# Patient Record
Sex: Male | Born: 1960 | Race: White | Hispanic: No | Marital: Married | Smoking: Never smoker
Health system: Southern US, Community
[De-identification: ages and names within clinical notes are randomized; demographics above are authoritative.]

## PROBLEM LIST (undated history)

## (undated) DIAGNOSIS — R569 Unspecified convulsions: Secondary | ICD-10-CM

## (undated) HISTORY — PX: HAND SURGERY: SHX662

---

## 2020-09-11 ENCOUNTER — Other Ambulatory Visit: Payer: Self-pay

## 2020-09-11 ENCOUNTER — Ambulatory Visit
Admission: EM | Admit: 2020-09-11 | Discharge: 2020-09-11 | Disposition: A | Discharge status: Home / Self Care | Payer: BC Managed Care – PPO | Attending: Family Medicine | Admitting: Family Medicine

## 2020-09-11 ENCOUNTER — Encounter: Payer: Self-pay | Admitting: Emergency Medicine

## 2020-09-11 DIAGNOSIS — R5383 Other fatigue: Secondary | ICD-10-CM

## 2020-09-11 DIAGNOSIS — R42 Dizziness and giddiness: Secondary | ICD-10-CM

## 2020-09-11 NOTE — ED Triage Notes (Signed)
Has been having "episodes of feeling like an out of body experience"  Wife states when this happens, he becomes sweaty and loses his color.  States hands and finger get numb from time to time.  Denies pain.  States today with an episode, he felt short of breath.

## 2020-09-11 NOTE — Discharge Instructions (Addendum)
We have drawn some labs today.  I have also placed a referral for neurology.  Call him and see if they will take this without a primary care referral.  They may see you with the urgent care referral.  We will be in contact with any abnormal lab results.  If things are getting worse, if you lose consciousness, if you feel more so like you are going to pass out, if you start having any chest pain, loss of strength in any of your limbs, limb heaviness, other concerning symptoms I would have you follow-up in the ER

## 2020-09-11 NOTE — ED Provider Notes (Signed)
[facility]    CSN: [id number] Arrival date & time: 09/11/20  1024      History   Chief Complaint No chief complaint on file.   HPI Ricardo Stout is a 60 y.o. male.   Reports that he has been having episodes of feeling "off."  Per wife, when these episodes happen she notices that he is taking long deep breaths, that he goes and sits or lies down somewhere, and that he gets very pale.  States that the episodes have happened a couple of times this week.  States that he started having episodes couple of months ago.  Does not have PCP.  Also states that either one of his hands and fingers at times will become numb and tingly.  States that he can usually get the feeling back with wiggling his hands and moving his arms.  With his episode that occurred this morning, he states that he got a little short of breath, and that is what brought him in today.  Does not take prescription medications.  Denies personal cardiac history.  Reports that his father had a heart attack at about 26 years old.  Denies headache, shortness of breath currently, increased fatigue, weakness, chest pain,, arm pain, radiating pain, jaw pain, back pain, diaphoresis, previous symptoms.  Inquiring about whether he could possibly be having small seizures.  Has not attempted to treat this at home other than with rest.  This seems to help him, and he feels like he is back to himself within a few minutes.  ROS per HPI  The history is provided by the patient.    History reviewed. No pertinent past medical history.  There are no problems to display for this patient.   History reviewed. No pertinent surgical history.     Home Medications    Prior to Admission medications   Not on File    Family History Family History  Problem Relation Age of Onset  . Thyroid disease Mother   . Cancer Father   . Heart attack Father   . COPD Father     Social History Social History   Tobacco Use  . Smoking  status: Never Smoker  . Smokeless tobacco: Never Used  Vaping Use  . Vaping Use: Never used  Substance Use Topics  . Alcohol use: Yes    Alcohol/week: 2.0 standard drinks    Types: 2 Cans of beer per week  . Drug use: Yes    Types: Marijuana    Comment: daily     Allergies   Patient has no known allergies.   Review of Systems Review of Systems   Physical Exam Triage Vital Signs ED Triage Vitals  Enc Vitals Group     BP 09/11/20 1052 125/81     Pulse Rate 09/11/20 1052 68     Resp 09/11/20 1052 16     Temp 09/11/20 1052 98.4 F (36.9 C)     Temp Source 09/11/20 1052 Oral     SpO2 09/11/20 1052 96 %     Weight --      Height --      Head Circumference --      Peak Flow --      Pain Score 09/11/20 1056 0     Pain Loc --      Pain Edu? --      Excl. in GC? --    No data found.  Updated Vital Signs BP 125/81 (BP Location: Right Arm)  Pulse 68   Temp 98.4 F (36.9 C) (Oral)   Resp 16   SpO2 96%   Physical Exam Vitals and nursing note reviewed.  Constitutional:      General: He is not in acute distress.    Appearance: Normal appearance. He is well-developed and normal weight. He is not ill-appearing.  HENT:     Head: Normocephalic and atraumatic.     Right Ear: Tympanic membrane, ear canal and external ear normal.     Left Ear: Tympanic membrane, ear canal and external ear normal.     Mouth/Throat:     Mouth: Mucous membranes are moist.     Pharynx: Oropharynx is clear.  Eyes:     Extraocular Movements: Extraocular movements intact.     Conjunctiva/sclera: Conjunctivae normal.     Pupils: Pupils are equal, round, and reactive to light.  Cardiovascular:     Rate and Rhythm: Normal rate and regular rhythm.     Heart sounds: Normal heart sounds. No murmur heard.   Pulmonary:     Effort: Pulmonary effort is normal. No respiratory distress.     Breath sounds: Normal breath sounds. No stridor. No wheezing, rhonchi or rales.  Chest:     Chest wall: No  tenderness.  Abdominal:     General: Bowel sounds are normal. There is no distension.     Palpations: Abdomen is soft. There is no mass.     Tenderness: There is no abdominal tenderness. There is no right CVA tenderness, left CVA tenderness, guarding or rebound.     Hernia: No hernia is present.  Musculoskeletal:        General: Normal range of motion.     Cervical back: Normal range of motion and neck supple.  Skin:    General: Skin is warm and dry.     Capillary Refill: Capillary refill takes less than 2 seconds.  Neurological:     General: No focal deficit present.     Mental Status: He is alert and oriented to person, place, and time.     Cranial Nerves: No cranial nerve deficit.     Sensory: No sensory deficit.     Motor: No weakness.     Coordination: Coordination normal.     Gait: Gait normal.     Deep Tendon Reflexes: Reflexes normal.  Psychiatric:        Mood and Affect: Mood normal.        Behavior: Behavior normal.        Thought Content: Thought content normal.      UC Treatments / Results  Labs (all labs ordered are listed, but only abnormal results are displayed) Labs Reviewed  CBC WITH DIFFERENTIAL/PLATELET  COMPREHENSIVE METABOLIC PANEL  TSH    EKG   Radiology No results found.  Procedures Procedures (including critical care time)  Medications Ordered in UC Medications - No data to display  Initial Impression / Assessment and Plan / UC Course  I have reviewed the triage vital signs and the nursing notes.  Pertinent labs & imaging results that were available during my care of the patient were reviewed by me and considered in my medical decision making (see chart for details).    Fatigue Episodic lightheadedness  CBC, CMP, TSH drawn today Will be in contact with abnormal results Get established with PCP Placed neurology referral Discussed that this could be related to his heart, or his brain Discussed to keep a log of his  symptoms Discussed establishing PCP  Discussed that if episodes occur more often, with more intensity, or if you lose consciousness, to follow-up in the ER for more acute intervention  Final Clinical Impressions(s) / UC Diagnoses   Final diagnoses:  Other fatigue  Episodic lightheadedness     Discharge Instructions     We have drawn some labs today.  I have also placed a referral for neurology.  Call him and see if they will take this without a primary care referral.  They may see you with the urgent care referral.  We will be in contact with any abnormal lab results.  If things are getting worse, if you lose consciousness, if you feel more so like you are going to pass out, if you start having any chest pain, loss of strength in any of your limbs, limb heaviness, other concerning symptoms I would have you follow-up in the ER    ED Prescriptions    None     PDMP not reviewed this encounter.   Moshe Cipro, NP 09/11/20 1924

## 2020-09-12 LAB — COMPREHENSIVE METABOLIC PANEL
ALT: 22 IU/L (ref 0–44)
AST: 24 IU/L (ref 0–40)
Albumin/Globulin Ratio: 2.3 — ABNORMAL HIGH (ref 1.2–2.2)
Albumin: 4.8 g/dL (ref 3.8–4.9)
Alkaline Phosphatase: 105 IU/L (ref 44–121)
BUN/Creatinine Ratio: 15 (ref 9–20)
BUN: 13 mg/dL (ref 6–24)
Bilirubin Total: 0.4 mg/dL (ref 0.0–1.2)
CO2: 17 mmol/L — ABNORMAL LOW (ref 20–29)
Calcium: 9.8 mg/dL (ref 8.7–10.2)
Chloride: 104 mmol/L (ref 96–106)
Creatinine, Ser: 0.87 mg/dL (ref 0.76–1.27)
Globulin, Total: 2.1 g/dL (ref 1.5–4.5)
Glucose: 93 mg/dL (ref 65–99)
Potassium: 4.8 mmol/L (ref 3.5–5.2)
Sodium: 136 mmol/L (ref 134–144)
Total Protein: 6.9 g/dL (ref 6.0–8.5)
eGFR: 100 mL/min/{1.73_m2} (ref >59)

## 2020-09-12 LAB — CBC WITH DIFFERENTIAL/PLATELET
Basophils Absolute: 0 10*3/uL (ref 0.0–0.2)
Basos: 1 %
EOS (ABSOLUTE): 0 10*3/uL (ref 0.0–0.4)
Eos: 0 %
Hematocrit: 45.6 % (ref 37.5–51.0)
Hemoglobin: 15.9 g/dL (ref 13.0–17.7)
Immature Grans (Abs): 0 10*3/uL (ref 0.0–0.1)
Immature Granulocytes: 0 %
Lymphocytes Absolute: 1.2 10*3/uL (ref 0.7–3.1)
Lymphs: 15 %
MCH: 32.4 pg (ref 26.6–33.0)
MCHC: 34.9 g/dL (ref 31.5–35.7)
MCV: 93 fL (ref 79–97)
Monocytes Absolute: 0.5 10*3/uL (ref 0.1–0.9)
Monocytes: 6 %
Neutrophils Absolute: 6.4 10*3/uL (ref 1.4–7.0)
Neutrophils: 78 %
Platelets: 272 10*3/uL (ref 150–450)
RBC: 4.91 x10E6/uL (ref 4.14–5.80)
RDW: 11.9 % (ref 11.6–15.4)
WBC: 8.1 10*3/uL (ref 3.4–10.8)

## 2020-09-12 LAB — TSH: TSH: 1.27 u[IU]/mL (ref 0.450–4.500)

## 2020-10-07 ENCOUNTER — Ambulatory Visit (INDEPENDENT_AMBULATORY_CARE_PROVIDER_SITE_OTHER): Payer: BC Managed Care – PPO | Admitting: Diagnostic Neuroimaging

## 2020-10-07 ENCOUNTER — Telehealth: Payer: Self-pay | Admitting: Diagnostic Neuroimaging

## 2020-10-07 ENCOUNTER — Encounter: Payer: Self-pay | Admitting: Diagnostic Neuroimaging

## 2020-10-07 VITALS — BP 109/67 | HR 55 | Ht 69.0 in | Wt 179.8 lb

## 2020-10-07 DIAGNOSIS — R4689 Other symptoms and signs involving appearance and behavior: Secondary | ICD-10-CM | POA: Diagnosis not present

## 2020-10-07 NOTE — Patient Instructions (Signed)
ABNL SPELLS (disconnected feeling, pale appearance, sweating; seizure vs pre-syncope) - MRI brain, EEG - caution with driving - follow up with PCP and cardiology

## 2020-10-07 NOTE — Telephone Encounter (Addendum)
MRI brain w/wo contrast 45 mins dr. Marjory Lies bcbs Berkley Harvey: [phone] (10/07/20-12/05/20)   Scheduled at [facility] 10/09/20

## 2020-10-07 NOTE — Progress Notes (Signed)
[facility]  PATIENT: Ricardo Stout DOB: 05-19-1960  REFERRING CLINICIAN: Moshe Cipro, NP HISTORY FROM: patient  REASON FOR VISIT: new consult    HISTORICAL  CHIEF COMPLAINT:  Chief Complaint  Patient presents with  . Lightheadedness, fatigue    Rm 7 New pt, ED referral, wife- [name] "in past 3-4 months- have a feeling of disconnecting, sweating, breathing changes, color drains, happening frequently, last 30-60 seconds"     HISTORY OF PRESENT ILLNESS:   60 year old male here for evaluation of abnormal spells.  For past 3 to 4 months patient has had onset of abnormal sensations consisting of disconnected feeling, sweating, pale appearance, heavy breathing, lasting 30 to 60 seconds at a time.  Patient has had multiple episodes per day.  No specific triggering factors.  These can occur when sitting or resting.  A few of these episodes of occurred when he is concentrating on the computer or scrolling through his phone quickly.  He has not had any episodes last week and a half.  No prior similar events.  No family history of seizures.  No prior head injuries or traumas.   REVIEW OF SYSTEMS: Full 14 system review of systems performed and negative with exception of: as per HPI.    ALLERGIES: No Known Allergies  HOME MEDICATIONS: No outpatient medications prior to visit.   No facility-administered medications prior to visit.    PAST MEDICAL HISTORY: No past medical history on file.  PAST SURGICAL HISTORY: Past Surgical History:  Procedure Laterality Date  . HAND SURGERY Right     FAMILY HISTORY: Family History  Problem Relation Age of Onset  . Thyroid disease Mother   . Cancer Father   . Heart attack Father   . COPD Father   . Breast cancer Sister   . Hypertension Sister     SOCIAL HISTORY: Social History   Socioeconomic History  . Marital status: Married    Spouse name: [name]  . Number of children: 1  . Years of education: Not on file   . Highest education level: Not on file  Occupational History  . Not on file  Tobacco Use  . Smoking status: Never Smoker  . Smokeless tobacco: Never Used  Vaping Use  . Vaping Use: Never used  Substance and Sexual Activity  . Alcohol use: Yes    Alcohol/week: 2.0 standard drinks    Types: 2 Cans of beer per week    Comment: or less  . Drug use: Yes    Types: Marijuana    Comment: 10/07/20 daily  . Sexual activity: Not on file  Other Topics Concern  . Not on file  Social History Narrative   Lives with wife   'caffeine- maybe 1 soda a day   Social Determinants of Health   Financial Resource Strain: Not on file  Food Insecurity: Not on file  Transportation Needs: Not on file  Physical Activity: Not on file  Stress: Not on file  Social Connections: Not on file  Intimate Partner Violence: Not on file     PHYSICAL EXAM  GENERAL EXAM/CONSTITUTIONAL: Vitals:  Vitals:   10/07/20 0928  BP: 109/67  Pulse: (!) 55  Weight: 179 lb 12.8 oz (81.6 kg)  Height: 5\' 9"  (1.753 m)   Body mass index is 26.55 kg/m. Wt Readings from Last 3 Encounters:  10/07/20 179 lb 12.8 oz (81.6 kg)    Patient is in no distress; well developed, nourished and groomed; neck is supple  CARDIOVASCULAR:  Examination of carotid arteries is normal; no carotid bruits  Regular rate and rhythm, no murmurs  Examination of peripheral vascular system by observation and palpation is normal  EYES:  Ophthalmoscopic exam of optic discs and posterior segments is normal; no papilledema or hemorrhages No exam data present  MUSCULOSKELETAL:  Gait, strength, tone, movements noted in Neurologic exam below  NEUROLOGIC: MENTAL STATUS:  No flowsheet data found.  awake, alert, oriented to person, place and time  recent and remote memory intact  normal attention and concentration  language fluent, comprehension intact, naming intact  fund of knowledge appropriate  CRANIAL NERVE:   2nd - no  papilledema on fundoscopic exam  2nd, 3rd, 4th, 6th - pupils equal and reactive to light, visual fields full to confrontation, extraocular muscles intact, no nystagmus  5th - facial sensation symmetric  7th - facial strength symmetric  8th - hearing intact  9th - palate elevates symmetrically, uvula midline  11th - shoulder shrug symmetric  12th - tongue protrusion midline  MOTOR:   normal bulk and tone, full strength in the BUE, BLE  SENSORY:   normal and symmetric to light touch, temperature, vibration  COORDINATION:   finger-nose-finger, fine finger movements normal  REFLEXES:   deep tendon reflexes present and symmetric  GAIT/STATION:   narrow based gait     DIAGNOSTIC DATA (LABS, IMAGING, TESTING) - I reviewed patient records, labs, notes, testing and imaging myself where available.  Lab Results  Component Value Date   WBC 8.1 09/11/2020   HGB 15.9 09/11/2020   HCT 45.6 09/11/2020   MCV 93 09/11/2020   PLT 272 09/11/2020      Component Value Date/Time   NA 136 09/11/2020 1140   K 4.8 09/11/2020 1140   CL 104 09/11/2020 1140   CO2 17 (L) 09/11/2020 1140   GLUCOSE 93 09/11/2020 1140   BUN 13 09/11/2020 1140   CREATININE 0.87 09/11/2020 1140   CALCIUM 9.8 09/11/2020 1140   PROT 6.9 09/11/2020 1140   ALBUMIN 4.8 09/11/2020 1140   AST 24 09/11/2020 1140   ALT 22 09/11/2020 1140   ALKPHOS 105 09/11/2020 1140   BILITOT 0.4 09/11/2020 1140   No results found for: CHOL, HDL, LDLCALC, LDLDIRECT, TRIG, CHOLHDL No results found for: LKGM0N No results found for: VITAMINB12 Lab Results  Component Value Date   TSH 1.270 09/11/2020       ASSESSMENT AND PLAN  60 y.o. year old male here with multiple stereotyped episodes of disconnected feeling, out of body sensation, sweating, pale appearance, heavy breathing, suspicious for complex partial seizures.  Will check MRI and EEG for further evaluation.  Recommend PCP and cardiology evaluation to rule out  other medical/cardiac etiologies for hypotension/presyncope.  Patient does not fully lose consciousness and is able to control himself during episodes with good warning, therefore recommend caution with driving.  Offered to start antiseizure medications but patient is reluctant at this time would like to pursue testing first.   Dx:  1. Spell of abnormal behavior      PLAN:  ABNL SPELLS (disconnected feeling, pale appearance, sweating; seizure vs pre-syncope) - MRI brain, EEG - caution with driving - consider levetiracetam if symptoms continue - follow up with PCP and cardiology  Orders Placed This Encounter  Procedures  . MR BRAIN W WO CONTRAST  . EEG adult   Return in about 3 months (around 01/07/2021).  I reviewed images, labs, notes, records myself. I summarized findings and reviewed with patient, for this  high risk condition (abnl spells; seizure?) requiring high complexity decision making.    Suanne Marker, MD 10/07/2020, 10:16 AM Certified in Neurology, Neurophysiology and Neuroimaging  [facility] [address], [address] [place], [place] [address] [phone]

## 2020-10-08 ENCOUNTER — Other Ambulatory Visit: Payer: BC Managed Care – PPO

## 2020-10-09 ENCOUNTER — Other Ambulatory Visit: Payer: BC Managed Care – PPO

## 2020-10-09 ENCOUNTER — Encounter: Payer: Self-pay | Admitting: Diagnostic Neuroimaging

## 2020-11-10 ENCOUNTER — Emergency Department (HOSPITAL_COMMUNITY)
Admission: EM | Admit: 2020-11-10 | Discharge: 2020-11-10 | Disposition: A | Discharge status: Home / Self Care | Payer: BC Managed Care – PPO | Source: Home / Self Care | Attending: Emergency Medicine | Admitting: Emergency Medicine

## 2020-11-10 ENCOUNTER — Emergency Department (HOSPITAL_COMMUNITY): Payer: BC Managed Care – PPO

## 2020-11-10 DIAGNOSIS — R569 Unspecified convulsions: Secondary | ICD-10-CM

## 2020-11-10 DIAGNOSIS — M25512 Pain in left shoulder: Secondary | ICD-10-CM | POA: Insufficient documentation

## 2020-11-10 DIAGNOSIS — S32028A Other fracture of second lumbar vertebra, initial encounter for closed fracture: Secondary | ICD-10-CM | POA: Insufficient documentation

## 2020-11-10 DIAGNOSIS — S22080A Wedge compression fracture of T11-T12 vertebra, initial encounter for closed fracture: Secondary | ICD-10-CM

## 2020-11-10 DIAGNOSIS — S22088A Other fracture of T11-T12 vertebra, initial encounter for closed fracture: Secondary | ICD-10-CM | POA: Insufficient documentation

## 2020-11-10 DIAGNOSIS — M545 Low back pain, unspecified: Secondary | ICD-10-CM | POA: Insufficient documentation

## 2020-11-10 DIAGNOSIS — X58XXXA Exposure to other specified factors, initial encounter: Secondary | ICD-10-CM | POA: Insufficient documentation

## 2020-11-10 DIAGNOSIS — S32020A Wedge compression fracture of second lumbar vertebra, initial encounter for closed fracture: Secondary | ICD-10-CM

## 2020-11-10 DIAGNOSIS — M25511 Pain in right shoulder: Secondary | ICD-10-CM | POA: Insufficient documentation

## 2020-11-10 DIAGNOSIS — Y900 Blood alcohol level of less than 20 mg/100 ml: Secondary | ICD-10-CM | POA: Insufficient documentation

## 2020-11-10 DIAGNOSIS — S22089A Unspecified fracture of T11-T12 vertebra, initial encounter for closed fracture: Secondary | ICD-10-CM | POA: Diagnosis not present

## 2020-11-10 LAB — CBC WITH DIFFERENTIAL/PLATELET
Abs Immature Granulocytes: 0.31 10*3/uL — ABNORMAL HIGH (ref 0.00–0.07)
Abs Immature Granulocytes: 0.22 10*3/uL — ABNORMAL HIGH (ref 0.00–0.07)
Basophils Absolute: 0.1 10*3/uL (ref 0.0–0.1)
Basophils Absolute: 0 10*3/uL (ref 0.0–0.1)
Basophils Relative: 0 %
Basophils Relative: 0 %
Eosinophils Absolute: 0 10*3/uL (ref 0.0–0.5)
Eosinophils Absolute: 0 10*3/uL (ref 0.0–0.5)
Eosinophils Relative: 0 %
Eosinophils Relative: 0 %
HCT: 44.8 % (ref 39.0–52.0)
HCT: 44.6 % (ref 39.0–52.0)
Hemoglobin: 15.4 g/dL (ref 13.0–17.0)
Hemoglobin: 15.3 g/dL (ref 13.0–17.0)
Immature Granulocytes: 2 %
Immature Granulocytes: 1 %
Lymphocytes Relative: 4 %
Lymphocytes Relative: 3 %
Lymphs Abs: 0.9 10*3/uL (ref 0.7–4.0)
Lymphs Abs: 0.5 10*3/uL — ABNORMAL LOW (ref 0.7–4.0)
MCH: 32.7 pg (ref 26.0–34.0)
MCH: 32.9 pg (ref 26.0–34.0)
MCHC: 34.4 g/dL (ref 30.0–36.0)
MCHC: 34.3 g/dL (ref 30.0–36.0)
MCV: 95.1 fL (ref 80.0–100.0)
MCV: 95.9 fL (ref 80.0–100.0)
Monocytes Absolute: 1.2 10*3/uL — ABNORMAL HIGH (ref 0.1–1.0)
Monocytes Absolute: 1.1 10*3/uL — ABNORMAL HIGH (ref 0.1–1.0)
Monocytes Relative: 6 %
Monocytes Relative: 6 %
Neutro Abs: 18.7 10*3/uL — ABNORMAL HIGH (ref 1.7–7.7)
Neutro Abs: 17.5 10*3/uL — ABNORMAL HIGH (ref 1.7–7.7)
Neutrophils Relative %: 88 %
Neutrophils Relative %: 90 %
Platelets: 218 10*3/uL (ref 150–400)
Platelets: 204 10*3/uL (ref 150–400)
RBC: 4.71 MIL/uL (ref 4.22–5.81)
RBC: 4.65 MIL/uL (ref 4.22–5.81)
RDW: 12.1 % (ref 11.5–15.5)
RDW: 12.5 % (ref 11.5–15.5)
WBC: 21.1 10*3/uL — ABNORMAL HIGH (ref 4.0–10.5)
WBC: 19.4 10*3/uL — ABNORMAL HIGH (ref 4.0–10.5)
nRBC: 0 % (ref 0.0–0.2)
nRBC: 0 % (ref 0.0–0.2)

## 2020-11-10 LAB — COMPREHENSIVE METABOLIC PANEL
ALT: 32 U/L (ref 0–44)
ALT: 34 U/L (ref 0–44)
AST: 37 U/L (ref 15–41)
AST: 48 U/L — ABNORMAL HIGH (ref 15–41)
Albumin: 3.8 g/dL (ref 3.5–5.0)
Albumin: 3.7 g/dL (ref 3.5–5.0)
Alkaline Phosphatase: 75 U/L (ref 38–126)
Alkaline Phosphatase: 70 U/L (ref 38–126)
Anion gap: 9 (ref 5–15)
Anion gap: 12 (ref 5–15)
BUN: 14 mg/dL (ref 6–20)
BUN: 14 mg/dL (ref 6–20)
CO2: 21 mmol/L — ABNORMAL LOW (ref 22–32)
CO2: 17 mmol/L — ABNORMAL LOW (ref 22–32)
Calcium: 8.9 mg/dL (ref 8.9–10.3)
Calcium: 8.8 mg/dL — ABNORMAL LOW (ref 8.9–10.3)
Chloride: 106 mmol/L (ref 98–111)
Chloride: 107 mmol/L (ref 98–111)
Creatinine, Ser: 1.1 mg/dL (ref 0.61–1.24)
Creatinine, Ser: 1.15 mg/dL (ref 0.61–1.24)
GFR, Estimated: >60 mL/min (ref >60)
GFR, Estimated: >60 mL/min (ref >60)
Glucose, Bld: 128 mg/dL — ABNORMAL HIGH (ref 70–99)
Glucose, Bld: 129 mg/dL — ABNORMAL HIGH (ref 70–99)
Potassium: 3.7 mmol/L (ref 3.5–5.1)
Potassium: 4.4 mmol/L (ref 3.5–5.1)
Sodium: 136 mmol/L (ref 135–145)
Sodium: 136 mmol/L (ref 135–145)
Total Bilirubin: 1 mg/dL (ref 0.3–1.2)
Total Bilirubin: 1.4 mg/dL — ABNORMAL HIGH (ref 0.3–1.2)
Total Protein: 6.2 g/dL — ABNORMAL LOW (ref 6.5–8.1)
Total Protein: 6.2 g/dL — ABNORMAL LOW (ref 6.5–8.1)

## 2020-11-10 LAB — MAGNESIUM: Magnesium: 2 mg/dL (ref 1.7–2.4)

## 2020-11-10 LAB — CBG MONITORING, ED: Glucose-Capillary: 160 mg/dL — ABNORMAL HIGH (ref 70–99)

## 2020-11-10 LAB — ETHANOL: Alcohol, Ethyl (B): <10 mg/dL (ref <10)

## 2020-11-10 MED ORDER — HYDROMORPHONE HCL 1 MG/ML IJ SOLN
1.0000 mg | Freq: Once | INTRAMUSCULAR | Status: AC
Start: 2020-11-10 — End: 2020-11-10
  Administered 2020-11-10: 1 mg via INTRAVENOUS
  Filled 2020-11-10: qty 1

## 2020-11-10 MED ORDER — GADOBUTROL 1 MMOL/ML IV SOLN
7.5000 mL | Freq: Once | INTRAVENOUS | Status: AC | PRN
  Administered 2020-11-10: 7.5 mL via INTRAVENOUS

## 2020-11-10 MED ORDER — MORPHINE SULFATE (PF) 4 MG/ML IV SOLN
4.0000 mg | Freq: Once | INTRAVENOUS | Status: AC
  Administered 2020-11-10: 4 mg via INTRAVENOUS
  Filled 2020-11-10: qty 1

## 2020-11-10 MED ORDER — ONDANSETRON HCL 4 MG/2ML IJ SOLN
4.0000 mg | Freq: Once | INTRAMUSCULAR | Status: AC
  Administered 2020-11-10: 4 mg via INTRAVENOUS
  Filled 2020-11-10: qty 2

## 2020-11-10 MED ORDER — SODIUM CHLORIDE 0.9 % IV BOLUS
500.0000 mL | Freq: Once | INTRAVENOUS | Status: AC
  Administered 2020-11-10: 500 mL via INTRAVENOUS

## 2020-11-10 MED ORDER — ONDANSETRON 4 MG PO TBDP: 4.0000 mg | ORAL_TABLET | Freq: Three times a day (TID) | ORAL | 0 refills | Status: DC | PRN

## 2020-11-10 MED ORDER — LACTATED RINGERS IV BOLUS
1000.0000 mL | Freq: Once | INTRAVENOUS | Status: AC
  Administered 2020-11-10: 1000 mL via INTRAVENOUS

## 2020-11-10 MED ORDER — CALCIUM CARBONATE ANTACID 500 MG PO CHEW
1.0000 | CHEWABLE_TABLET | Freq: Once | ORAL | Status: AC
  Administered 2020-11-10: 200 mg via ORAL
  Filled 2020-11-10: qty 1

## 2020-11-10 MED ORDER — LEVETIRACETAM 500 MG PO TABS: 500.0000 mg | ORAL_TABLET | Freq: Two times a day (BID) | ORAL | 0 refills | Status: DC

## 2020-11-10 MED ORDER — OXYCODONE-ACETAMINOPHEN 5-325 MG PO TABS
2.0000 | ORAL_TABLET | Freq: Once | ORAL | Status: AC
  Administered 2020-11-10: 2 via ORAL
  Filled 2020-11-10: qty 2

## 2020-11-10 MED ORDER — HYDROMORPHONE HCL 1 MG/ML IJ SOLN
1.0000 mg | Freq: Once | INTRAMUSCULAR | Status: AC
  Administered 2020-11-10: 1 mg via INTRAVENOUS
  Filled 2020-11-10: qty 1

## 2020-11-10 MED ORDER — KETOROLAC TROMETHAMINE 30 MG/ML IJ SOLN
30.0000 mg | Freq: Once | INTRAMUSCULAR | Status: AC
  Administered 2020-11-10: 30 mg via INTRAVENOUS
  Filled 2020-11-10: qty 1

## 2020-11-10 MED ORDER — OXYCODONE-ACETAMINOPHEN 5-325 MG PO TABS: 1.0000 | ORAL_TABLET | Freq: Four times a day (QID) | ORAL | 0 refills | Status: DC | PRN

## 2020-11-10 MED ORDER — LEVETIRACETAM IN NACL 1000 MG/100ML IV SOLN
1000.0000 mg | Freq: Two times a day (BID) | INTRAVENOUS | Status: DC
  Administered 2020-11-10: 1000 mg via INTRAVENOUS
  Filled 2020-11-10: qty 100

## 2020-11-10 MED ORDER — OXYCODONE-ACETAMINOPHEN 5-325 MG PO TABS: 1.0000 | ORAL_TABLET | Freq: Four times a day (QID) | ORAL | 0 refills | Status: AC | PRN

## 2020-11-10 NOTE — ED Notes (Signed)
[facility] called at 20:34; ambulance will be here soon as patient is second in line.

## 2020-11-10 NOTE — ED Notes (Signed)
Pt and family verbalized understanding of discharge paperwork, including follow-up care and prescription.

## 2020-11-10 NOTE — Progress Notes (Signed)
Orthopedic Tech Progress Note Patient Details:  Ricardo Stout 14-Aug-1960 [id number]  Patient ID: [name], male   DOB: [birth year], 60 y.o.   MRN: [id number] [employer] [facility] Post 11/10/2020, 7:18 AM

## 2020-11-10 NOTE — ED Provider Notes (Signed)
[facility] Provider Note   CSN: [id number] Arrival date & time: 11/10/20  1515     History Chief Complaint  Patient presents with   Seizures    Ricardo MelterRussell Stout is a 60 y.o. male.  HPI Patient presents from home after witnessed seizure.  Seizure was several moments long, and EMS notes that on arrival the patient was postictal, but improved in route, was ANO x3 prior to ED arrival. The patient self is quiet, complains of bilateral shoulder pain, denies other new pain aside from that in his lower back.  He has recent injury during which he sustained multiple compression fractures and is currently wearing his immobilization brace. Additional history obtained by the patient's wife on her arrival, and chart review.  On chart review patient has a ongoing evaluation with neurology, including visit 1 month ago during which plan was for EEG, MRI, initiation of Keppra if you continue to have brief periods of altered mental status which were occurring for several weeks at that point. Patient notes that he has not had follow-up yet with his neurologist, no EEG.  Did, however, have MRI performed last night.  Patient was seen and evaluated yesterday after what sounds like a similar seizure event, though of longer duration than this morning.  During last night's event he also had a fall, sustained a compression fracture as above.     No past medical history on file.  There are no problems to display for this patient.   Past Surgical History:  Procedure Laterality Date   HAND SURGERY Right        Family History  Problem Relation Age of Onset   Thyroid disease Mother    Cancer Father    Heart attack Father    COPD Father    Breast cancer Sister    Hypertension Sister     Social History   Tobacco Use   Smoking status: Never   Smokeless tobacco: Never  Vaping Use   Vaping Use: Never used  Substance Use Topics   Alcohol use: Yes     Alcohol/week: 2.0 standard drinks    Types: 2 Cans of beer per week    Comment: or less   Drug use: Yes    Types: Marijuana    Comment: 10/07/20 daily    Home Medications Prior to Admission medications   Medication Sig Start Date End Date Taking? Authorizing Provider  oxyCODONE-acetaminophen (PERCOCET) 5-325 MG tablet Take 1 tablet by mouth every 6 (six) hours as needed for up to 5 days for severe pain. 11/10/20 11/15/20  Horton, Clabe SealKristie M, DO    Allergies    Patient has no known allergies.  Review of Systems   Review of Systems  Constitutional:        Per HPI, otherwise negative  HENT:         Per HPI, otherwise negative  Respiratory:         Per HPI, otherwise negative  Cardiovascular:        Per HPI, otherwise negative  Gastrointestinal:  Negative for vomiting.  Endocrine:       Negative aside from HPI  Genitourinary:        Neg aside from HPI   Musculoskeletal:        Per HPI, otherwise negative  Skin: Negative.   Neurological:  Positive for seizures. Negative for syncope.   Physical Exam Updated Vital Signs BP 125/88   Pulse 86   Temp 98.5 F (  36.9 C) (Oral)   Resp 19   Ht  (1.778 m)   Wt 79.4 kg   SpO2 93%   BMI 25.11 kg/m   Physical Exam Vitals and nursing note reviewed.  Constitutional:      General: He is not in acute distress.    Appearance: He is well-developed.  HENT:     Head: Normocephalic and atraumatic.  Eyes:     Conjunctiva/sclera: Conjunctivae normal.  Cardiovascular:     Rate and Rhythm: Normal rate and regular rhythm.  Pulmonary:     Effort: Pulmonary effort is normal. No respiratory distress.     Breath sounds: No stridor.  Chest:     Comments: Exoskeleton lumbar vertebral fracture brace in place. Abdominal:     General: There is no distension.  Musculoskeletal:     Comments: Patient describes pain in both shoulders, though range of motion is slightly restricted secondary to pain, he can flex and extend both shoulder, abduct  as well.  No gross deformity.  Skin:    General: Skin is warm and dry.  Neurological:     Mental Status: He is alert and oriented to person, place, and time.     Motor: No tremor, atrophy or abnormal muscle tone.    ED Results / Procedures / Treatments   Labs (all labs ordered are listed, but only abnormal results are displayed) Labs Reviewed  CBC WITH DIFFERENTIAL/PLATELET - Abnormal; Notable for the following components:      Result Value   WBC 19.4 (*)    Neutro Abs 17.5 (*)    Lymphs Abs 0.5 (*)    Monocytes Absolute 1.1 (*)    Abs Immature Granulocytes 0.22 (*)    All other components within normal limits  COMPREHENSIVE METABOLIC PANEL - Abnormal; Notable for the following components:   CO2 17 (*)    Glucose, Bld 129 (*)    Calcium 8.8 (*)    Total Protein 6.2 (*)    AST 48 (*)    Total Bilirubin 1.4 (*)    All other components within normal limits  CBG MONITORING, ED - Abnormal; Notable for the following components:   Glucose-Capillary 160 (*)    All other components within normal limits  ETHANOL  URINALYSIS, ROUTINE W REFLEX MICROSCOPIC  RAPID URINE DRUG SCREEN, HOSP PERFORMED    EKG None  Radiology CT Head Wo Contrast  Result Date: 11/10/2020 CLINICAL DATA:  60 year old male with dizziness. EXAM: CT HEAD WITHOUT CONTRAST TECHNIQUE: Contiguous axial images were obtained from the base of the skull through the vertex without intravenous contrast. COMPARISON:  None. FINDINGS: Brain: The ventricles and sulci appropriate size for patient's age. The gray-white matter discrimination is preserved. There is no acute intracranial hemorrhage. No mass effect or midline shift. No extra-axial fluid collection. Vascular: No hyperdense vessel or unexpected calcification. Skull: Normal. Negative for fracture or focal lesion. Sinuses/Orbits: There is mucoperiosteal thickening of paranasal sinuses with opacification of several ethmoid air cells. The mastoid air cells are clear.  Air-fluid level. Other: None IMPRESSION: 1. Unremarkable noncontrast CT of the brain. 2. Paranasal sinus disease. Electronically Signed   By: Elgie Collard M.D.   On: 11/10/2020 03:21   CT Lumbar Spine Wo Contrast  Result Date: 11/10/2020 CLINICAL DATA:  Initial evaluation for low back pain, trauma. EXAM: CT LUMBAR SPINE WITHOUT CONTRAST TECHNIQUE: Multidetector CT imaging of the lumbar spine was performed without intravenous contrast administration. Multiplanar CT image reconstructions were also generated. COMPARISON:  None. FINDINGS:  Segmentation: Standard. Lowest well-formed disc space labeled the L5-S1 level. Alignment: Physiologic with preservation of the normal lumbar lordosis. No listhesis. Vertebrae: Mild compression deformity involving the superior endplate of T11 with no more than mild 10% central height loss but no bony retropulsion, age indeterminate. Acute burst type compression fracture involving the T12 vertebral body with up to 35% height loss and 4 mm bony retropulsion. Suspected additional subtle acute compression fracture involving the superior endplate of L1 with minimal height loss but no bony retropulsion. Acute burst type compression fracture involving the superior endplate of L2 with up to 25% height loss with trace 2 mm bony retropulsion. Compression deformity involving the inferior endplate of L4 with up to 35% height loss without bony retropulsion, chronic in appearance. No other acute fracture. Visualized sacrum and pelvis intact. No discrete osseous lesions. Paraspinal and other soft tissues: Mild paraspinous edema adjacent to the acute compression fractures. Paraspinous soft tissues demonstrate no other acute finding. Mild aorto bi-iliac atherosclerotic disease. Disc levels: T11-12: 4 mm bony retropulsion related to the acute T12 compression fracture. No significant spinal stenosis. Foramina remain patent. T12-L1: Unremarkable. L1-2: Trace 2 mm bony retropulsion related to the L2  compression fracture. No significant spinal stenosis. Foramina remain patent. L2-3:  Negative interspace.  Mild facet hypertrophy.  No stenosis. L3-4: Mild disc bulge. Superimposed left foraminal disc protrusion contacts the exiting left L3 nerve root (series 3, image 102). Mild facet hypertrophy. No spinal stenosis. Mild left foraminal narrowing. L4-5: Mild disc bulge. Superimposed broad base right subarticular to foraminal disc protrusion, potentially affecting either the exiting right L4 or descending L5 nerve roots (series 3, image 118). Mild facet hypertrophy. No significant spinal stenosis. Mild left with mild to moderate right L4 foraminal narrowing. L5-S1: Mild disc bulge, eccentric to the left. 8 mm soft tissue density at the level of the left neural foramen likely reflects a small extruded disc fragment (series 3, image 133). This closely approximates and could potentially affect the exiting left L5 nerve root. Mild facet hypertrophy. No significant spinal stenosis. Mild left foraminal narrowing. IMPRESSION: 1. Acute compression fractures involving the T12 and L2 vertebral bodies, with additional possible subtle compression fracture involving the superior endplate of L1 as above. 2. Mild compression deformity involving the superior endplate of T11, age indeterminate, and could be chronic in nature. 3. Mild compression deformity involving the inferior endplate of L4, likely chronic. 4. Left foraminal disc protrusion at L3-4, contacting and potentially affecting the exiting left L3 nerve root. 5. Broad right subarticular to foraminal disc protrusion at L4-5, potentially affecting either the exiting right L4 or descending L5 nerve roots. 6. 8 mm soft tissue density at the level of the left neural foramen at L5-S1, likely a small extruded disc fragment. This closely approximates the exiting left L5 nerve root. Electronically Signed   By: Rise Mu M.D.   On: 11/10/2020 04:04   MR BRAIN W WO  CONTRAST  Result Date: 11/10/2020 CLINICAL DATA:  Seizure-like activity. Chronic headache. Neuro deficit with stroke suspected. EXAM: MRI HEAD WITHOUT AND WITH CONTRAST TECHNIQUE: Multiplanar, multiecho pulse sequences of the brain and surrounding structures were obtained without and with intravenous contrast. CONTRAST:  7.54mL GADAVIST GADOBUTROL 1 MMOL/ML IV SOLN COMPARISON:  Head CT from earlier today FINDINGS: Brain: No acute infarction, hemorrhage, hydrocephalus, extra-axial collection or mass lesion. No cortical finding to explain seizure. No abnormal enhancement. Vascular: Normal flow voids. Skull and upper cervical spine: Normal marrow signal. Sinuses/Orbits: Mild patchy opacification of paranasal  sinuses and left more than right mastoids. Other: Mild motion artifact IMPRESSION: Normal MRI of the brain. Electronically Signed   By: Marnee Spring M.D.   On: 11/10/2020 05:39   DG Chest Portable 1 View  Result Date: 11/10/2020 CLINICAL DATA:  Recent seizure-like activity EXAM: PORTABLE CHEST 1 VIEW COMPARISON:  None. FINDINGS: Cardiac shadow is within normal limits. The lungs are well aerated bilaterally. Some patchy airspace opacity is noted in the right upper lobe which may represent some early infiltrate. No bony abnormality is seen. IMPRESSION: Patchy airspace opacity in the right upper lobe suggestive of early infiltrate. Electronically Signed   By: Alcide Clever M.D.   On: 11/10/2020 03:00   DG Shoulder Left  Result Date: 11/10/2020 CLINICAL DATA:  Recent seizure-like activity with shoulder pain, initial encounter EXAM: LEFT SHOULDER - 2+ VIEW COMPARISON:  None. FINDINGS: Degenerative changes of the acromioclavicular joint are seen. No acute fracture or dislocation is seen. The underlying bony thorax is within normal limits. No soft tissue abnormality is seen. IMPRESSION: Degenerative change without acute abnormality. Electronically Signed   By: Alcide Clever M.D.   On: 11/10/2020 02:59   DG  Shoulder Left Portable  Result Date: 11/10/2020 CLINICAL DATA:  Left shoulder pain post suture. EXAM: LEFT SHOULDER COMPARISON:  None. FINDINGS: Minimal degenerative changes of the [facility] joint and glenohumeral joints. No acute fracture or dislocation. IMPRESSION: 1. No acute findings. 2. Mild degenerative changes. Electronically Signed   By: Elberta Fortis M.D.   On: 11/10/2020 16:38   DG Shoulder Right Portable  Result Date: 11/10/2020 CLINICAL DATA:  Right shoulder pain post seizure. EXAM: PORTABLE RIGHT SHOULDER COMPARISON:  None. FINDINGS: Minimal degenerative changes of the [facility] joint and glenohumeral joint. No acute fracture or dislocation. IMPRESSION: No acute findings. Electronically Signed   By: Elberta Fortis M.D.   On: 11/10/2020 16:37    Procedures Procedures   Medications Ordered in ED Medications  levETIRAcetam (KEPPRA) IVPB 1000 mg/100 mL premix (0 mg Intravenous Stopped 11/10/20 1643)  HYDROmorphone (DILAUDID) injection 1 mg (has no administration in time range)  ondansetron (ZOFRAN) injection 4 mg (has no administration in time range)  sodium chloride 0.9 % bolus 500 mL (0 mLs Intravenous Stopped 11/10/20 1740)  morphine 4 MG/ML injection 4 mg (4 mg Intravenous Given 11/10/20 1623)    ED Course  I have reviewed the triage vital signs and the nursing notes.  Pertinent labs & imaging results that were available during my care of the patient were reviewed by me and considered in my medical decision making (see chart for details).  -Initial evaluation I reviewed the patient's chart including documentation from MRI, CT, imaging last night.  Patient had not started Keppra, and after the initial evaluation and he was placed on continuous cardiac monitoring, pulse oximetry provided Keppra loading dose, 1 g.  Cardiac 80s sinus normal Pulse ox 99% room air normal  Update:, I discussed the patient's case with our neurologist.  She is in agreement on initiating Keppra.  We discussed MRI  performed last night, she notes the patient is likely appropriate for discharge with outpatient EEG, neurology follow-up.  6:48 PM Patient now improved, though he does continue to have some pain in his back, shoulders.  I reviewed his x-rays, no evidence for fractures bilaterally. With his wife present we had a lengthy conversation on need for follow-up, home care, monitoring, seizure threshold and importance of continuing antiepileptics with outpatient follow-up with neurology, orthopedics, and primary care. MDM Rules/Calculators/A&P  MDM Number of Diagnoses or Management Options Seizure (HCC): established, worsening   Amount and/or Complexity of Data Reviewed Clinical lab tests: ordered and reviewed Tests in the radiology section of CPT: ordered and reviewed Tests in the medicine section of CPT: reviewed and ordered Decide to obtain previous medical records or to obtain history from someone other than the patient: yes Obtain history from someone other than the patient: yes Review and summarize past medical records: yes Discuss the patient with other providers: yes Independent visualization of images, tracings, or specimens: yes  Risk of Complications, Morbidity, and/or Mortality Presenting problems: high Diagnostic procedures: high Management options: high  Critical Care Total time providing critical care: < 30 minutes  Patient Progress Patient progress: improved   Final Clinical Impression(s) / ED Diagnoses Final diagnoses:  Seizure [facility])    Rx / DC Orders ED Discharge Orders     None        Gerhard Munch, MD 11/10/20 1851

## 2020-11-10 NOTE — ED Triage Notes (Addendum)
Pt to ED via EMS from home c/o seizure, witnessed by the family last aprox 2 mins, described as tonic/clonic in nature. Pt was evaluated yesterday in ED for first time seizure activity and Discharged home from ED, also told he had fractures in thoracic vertebrae. Currently c/o back pain. No memory of seizure . On EMS arrival pt in post ictal state, more alert with time, A&O X 4  currently. #20 LAC, 50 Fentanyl, 4mg  zofran given by EMS. Last VS: 140/82, hr 92, 96%.

## 2020-11-10 NOTE — ED Triage Notes (Signed)
Pt bib gems after pt had seizure like activity. Wife woke up and found pt having a seizure like activity - unknown duration. Pt was incontinent of urine during seizure. No hx of seizures. Pt also c/o of back pain - unknown when the pain started. Pt agitated on arrival. 5mg  versed IM given PTA.   CBG: 130  BP:140/90  Spo2: 96% RA

## 2020-11-10 NOTE — ED Provider Notes (Signed)
Patient has been fitted for his back brace, will follow up with outpatient with neurology and neurosurgery.  Vitals are stable, patient will be discharged and treated as an outpatient.  Discharge plan and strict return to ED precautions discussed, patient verbalizes understanding and agreement.   Rozelle Logan, DO 11/10/20 0805

## 2020-11-10 NOTE — Discharge Instructions (Addendum)
Please be sure to follow-up with your neurologist and either our neurosurgery colleague, Dr. Yetta Barre or our orthopedic colleague, Dr. Shon Baton for ongoing management of your seizures and back fractures.  Return here for concerning changes in your condition.

## 2020-11-10 NOTE — ED Provider Notes (Signed)
[facility]   CSN: [id number] Arrival date & time: 11/10/20  0140     History Chief Complaint  Patient presents with   Seizures    Ricardo MelterRussell Stout is a 60 y.o. male.  Patient presents via EMS with his wife shortly behind him with witnessed seizure-like activity.  Patient's wife states that over last few months that intermittent episodes of feel like he is out of his mind or body.  She states he just feels really disconnected.  Has been seeing a neurologist for that he has MRI scheduled but has not been done.  She states tonight they were sleeping and she woke to the bed shaking.  She looked over and he was unresponsive, foaming at the mouth, urinary incontinence and he was shaking.  She was unable to arouse him.  This lasted for few minutes.  They called EMS.  On EMS arrival the patient was postictal and received some benzodiazepine prior to arriving here.   Seizures Seizure activity on arrival: no   Seizure type:  Grand mal Initial focality:  None     No past medical history on file.  There are no problems to display for this patient.   Past Surgical History:  Procedure Laterality Date   HAND SURGERY Right        Family History  Problem Relation Age of Onset   Thyroid disease Mother    Cancer Father    Heart attack Father    COPD Father    Breast cancer Sister    Hypertension Sister     Social History   Tobacco Use   Smoking status: Never   Smokeless tobacco: Never  Vaping Use   Vaping Use: Never used  Substance Use Topics   Alcohol use: Yes    Alcohol/week: 2.0 standard drinks    Types: 2 Cans of beer per week    Comment: or less   Drug use: Yes    Types: Marijuana    Comment: 10/07/20 daily    Home Medications Prior to Admission medications   Medication Sig Start Date End Date Taking? Authorizing Provider  levETIRAcetam (KEPPRA) 500 MG tablet Take 1 tablet (500 mg total) by mouth 2 (two) times  daily. 11/10/20   Gerhard MunchLockwood, Robert, MD  ondansetron (ZOFRAN ODT) 4 MG disintegrating tablet Take 1 tablet (4 mg total) by mouth every 8 (eight) hours as needed for nausea or vomiting. 11/10/20   Gerhard MunchLockwood, Robert, MD  oxyCODONE-acetaminophen (PERCOCET) 5-325 MG tablet Take 1 tablet by mouth every 6 (six) hours as needed for up to 5 days for severe pain. 11/10/20 11/15/20  Horton, Clabe SealKristie M, DO    Allergies    Patient has no known allergies.  Review of Systems   Review of Systems  Neurological:  Positive for seizures.   Physical Exam Updated Vital Signs BP (!) 136/92   Pulse 82   Temp 99 F (37.2 C) (Rectal)   Resp 18   Ht 5\' 10"  (1.778 m)   Wt 77.1 kg   SpO2 95%   BMI 24.39 kg/m   Physical Exam Vitals and nursing note reviewed.  Constitutional:      Appearance: He is well-developed.  HENT:     Head: Normocephalic and atraumatic.     Mouth/Throat:     Mouth: Mucous membranes are moist.     Pharynx: Oropharynx is clear.  Eyes:     Pupils: Pupils are equal, round, and reactive to light.  Cardiovascular:  Rate and Rhythm: Normal rate.  Pulmonary:     Effort: Pulmonary effort is normal. No respiratory distress.  Abdominal:     General: Abdomen is flat. There is no distension.  Musculoskeletal:        General: Tenderness (lower midline back) present. Normal range of motion.     Cervical back: Normal range of motion.  Skin:    General: Skin is warm and dry.     Coloration: Skin is not jaundiced or pale.  Neurological:     General: No focal deficit present.     Mental Status: He is alert.    ED Results / Procedures / Treatments   Labs (all labs ordered are listed, but only abnormal results are displayed) Labs Reviewed  CBC WITH DIFFERENTIAL/PLATELET - Abnormal; Notable for the following components:      Result Value   WBC 21.1 (*)    Neutro Abs 18.7 (*)    Monocytes Absolute 1.2 (*)    Abs Immature Granulocytes 0.31 (*)    All other components within normal limits   COMPREHENSIVE METABOLIC PANEL - Abnormal; Notable for the following components:   CO2 21 (*)    Glucose, Bld 128 (*)    Total Protein 6.2 (*)    All other components within normal limits  MAGNESIUM    EKG None  Radiology CT Head Wo Contrast  Result Date: 11/10/2020 CLINICAL DATA:  60 year old male with dizziness. EXAM: CT HEAD WITHOUT CONTRAST TECHNIQUE: Contiguous axial images were obtained from the base of the skull through the vertex without intravenous contrast. COMPARISON:  None. FINDINGS: Brain: The ventricles and sulci appropriate size for patient's age. The gray-white matter discrimination is preserved. There is no acute intracranial hemorrhage. No mass effect or midline shift. No extra-axial fluid collection. Vascular: No hyperdense vessel or unexpected calcification. Skull: Normal. Negative for fracture or focal lesion. Sinuses/Orbits: There is mucoperiosteal thickening of paranasal sinuses with opacification of several ethmoid air cells. The mastoid air cells are clear. Air-fluid level. Other: None IMPRESSION: 1. Unremarkable noncontrast CT of the brain. 2. Paranasal sinus disease. Electronically Signed   By: Elgie Collard M.D.   On: 11/10/2020 03:21   CT Lumbar Spine Wo Contrast  Result Date: 11/10/2020 CLINICAL DATA:  Initial evaluation for low back pain, trauma. EXAM: CT LUMBAR SPINE WITHOUT CONTRAST TECHNIQUE: Multidetector CT imaging of the lumbar spine was performed without intravenous contrast administration. Multiplanar CT image reconstructions were also generated. COMPARISON:  None. FINDINGS: Segmentation: Standard. Lowest well-formed disc space labeled the L5-S1 level. Alignment: Physiologic with preservation of the normal lumbar lordosis. No listhesis. Vertebrae: Mild compression deformity involving the superior endplate of T11 with no more than mild 10% central height loss but no bony retropulsion, age indeterminate. Acute burst type compression fracture involving the  T12 vertebral body with up to 35% height loss and 4 mm bony retropulsion. Suspected additional subtle acute compression fracture involving the superior endplate of L1 with minimal height loss but no bony retropulsion. Acute burst type compression fracture involving the superior endplate of L2 with up to 25% height loss with trace 2 mm bony retropulsion. Compression deformity involving the inferior endplate of L4 with up to 35% height loss without bony retropulsion, chronic in appearance. No other acute fracture. Visualized sacrum and pelvis intact. No discrete osseous lesions. Paraspinal and other soft tissues: Mild paraspinous edema adjacent to the acute compression fractures. Paraspinous soft tissues demonstrate no other acute finding. Mild aorto bi-iliac atherosclerotic disease. Disc levels: T11-12: 4 mm  bony retropulsion related to the acute T12 compression fracture. No significant spinal stenosis. Foramina remain patent. T12-L1: Unremarkable. L1-2: Trace 2 mm bony retropulsion related to the L2 compression fracture. No significant spinal stenosis. Foramina remain patent. L2-3:  Negative interspace.  Mild facet hypertrophy.  No stenosis. L3-4: Mild disc bulge. Superimposed left foraminal disc protrusion contacts the exiting left L3 nerve root (series 3, image 102). Mild facet hypertrophy. No spinal stenosis. Mild left foraminal narrowing. L4-5: Mild disc bulge. Superimposed broad base right subarticular to foraminal disc protrusion, potentially affecting either the exiting right L4 or descending L5 nerve roots (series 3, image 118). Mild facet hypertrophy. No significant spinal stenosis. Mild left with mild to moderate right L4 foraminal narrowing. L5-S1: Mild disc bulge, eccentric to the left. 8 mm soft tissue density at the level of the left neural foramen likely reflects a small extruded disc fragment (series 3, image 133). This closely approximates and could potentially affect the exiting left L5 nerve root.  Mild facet hypertrophy. No significant spinal stenosis. Mild left foraminal narrowing. IMPRESSION: 1. Acute compression fractures involving the T12 and L2 vertebral bodies, with additional possible subtle compression fracture involving the superior endplate of L1 as above. 2. Mild compression deformity involving the superior endplate of T11, age indeterminate, and could be chronic in nature. 3. Mild compression deformity involving the inferior endplate of L4, likely chronic. 4. Left foraminal disc protrusion at L3-4, contacting and potentially affecting the exiting left L3 nerve root. 5. Broad right subarticular to foraminal disc protrusion at L4-5, potentially affecting either the exiting right L4 or descending L5 nerve roots. 6. 8 mm soft tissue density at the level of the left neural foramen at L5-S1, likely a small extruded disc fragment. This closely approximates the exiting left L5 nerve root. Electronically Signed   By: Rise Mu M.D.   On: 11/10/2020 04:04   MR BRAIN W WO CONTRAST  Result Date: 11/10/2020 CLINICAL DATA:  Seizure-like activity. Chronic headache. Neuro deficit with stroke suspected. EXAM: MRI HEAD WITHOUT AND WITH CONTRAST TECHNIQUE: Multiplanar, multiecho pulse sequences of the brain and surrounding structures were obtained without and with intravenous contrast. CONTRAST:  7.39mL GADAVIST GADOBUTROL 1 MMOL/ML IV SOLN COMPARISON:  Head CT from earlier today FINDINGS: Brain: No acute infarction, hemorrhage, hydrocephalus, extra-axial collection or mass lesion. No cortical finding to explain seizure. No abnormal enhancement. Vascular: Normal flow voids. Skull and upper cervical spine: Normal marrow signal. Sinuses/Orbits: Mild patchy opacification of paranasal sinuses and left more than right mastoids. Other: Mild motion artifact IMPRESSION: Normal MRI of the brain. Electronically Signed   By: Marnee Spring M.D.   On: 11/10/2020 05:39   DG Chest Portable 1 View  Result Date:  11/10/2020 CLINICAL DATA:  Recent seizure-like activity EXAM: PORTABLE CHEST 1 VIEW COMPARISON:  None. FINDINGS: Cardiac shadow is within normal limits. The lungs are well aerated bilaterally. Some patchy airspace opacity is noted in the right upper lobe which may represent some early infiltrate. No bony abnormality is seen. IMPRESSION: Patchy airspace opacity in the right upper lobe suggestive of early infiltrate. Electronically Signed   By: Alcide Clever M.D.   On: 11/10/2020 03:00   DG Shoulder Left  Result Date: 11/10/2020 CLINICAL DATA:  Recent seizure-like activity with shoulder pain, initial encounter EXAM: LEFT SHOULDER - 2+ VIEW COMPARISON:  None. FINDINGS: Degenerative changes of the acromioclavicular joint are seen. No acute fracture or dislocation is seen. The underlying bony thorax is within normal limits. No soft tissue abnormality  is seen. IMPRESSION: Degenerative change without acute abnormality. Electronically Signed   By: Alcide Clever M.D.   On: 11/10/2020 02:59   DG Shoulder Left Portable  Result Date: 11/10/2020 CLINICAL DATA:  Left shoulder pain post suture. EXAM: LEFT SHOULDER COMPARISON:  None. FINDINGS: Minimal degenerative changes of the [facility] joint and glenohumeral joints. No acute fracture or dislocation. IMPRESSION: 1. No acute findings. 2. Mild degenerative changes. Electronically Signed   By: Elberta Fortis M.D.   On: 11/10/2020 16:38   DG Shoulder Right Portable  Result Date: 11/10/2020 CLINICAL DATA:  Right shoulder pain post seizure. EXAM: PORTABLE RIGHT SHOULDER COMPARISON:  None. FINDINGS: Minimal degenerative changes of the [facility] joint and glenohumeral joint. No acute fracture or dislocation. IMPRESSION: No acute findings. Electronically Signed   By: Elberta Fortis M.D.   On: 11/10/2020 16:37    Procedures Procedures   Medications Ordered in ED Medications  lactated ringers bolus 1,000 mL (0 mLs Intravenous Stopped 11/10/20 0536)  HYDROmorphone (DILAUDID) injection 1 mg  (1 mg Intravenous Given 11/10/20 0245)  oxyCODONE-acetaminophen (PERCOCET/ROXICET) 5-325 MG per tablet 2 tablet (2 tablets Oral Given 11/10/20 0444)  gadobutrol (GADAVIST) 1 MMOL/ML injection 7.5 mL (7.5 mLs Intravenous Contrast Given 11/10/20 0530)  ketorolac (TORADOL) 30 MG/ML injection 30 mg (30 mg Intravenous Given 11/10/20 1610)    ED Course  I have reviewed the triage vital signs and the nursing notes.  Pertinent labs & imaging results that were available during my care of the patient were reviewed by me and considered in my medical decision making (see chart for details).    MDM Rules/Calculators/A&P                          60 year old male with a first-time seizure.  Had been having abnormal episodes as an outpatient and saw neurology was planning for an MRI so we do that here.  I wonder now if those abnormal episodes were actually auras for seizures that did not actually happen.  He also has some compression fractures without any neurologic change so  No indication for MRI.  TLSO ordered.  Pain control attempted.  Care transferred pending reevaluation of pain after brace applied.   Final Clinical Impression(s) / ED Diagnoses Final diagnoses:  Seizure (HCC)  Compression fracture of L2 vertebra, initial encounter (HCC)  Compression fracture of T12 vertebra, initial encounter [facility])    Rx / DC Orders ED Discharge Orders          Ordered    oxyCODONE-acetaminophen (PERCOCET) 5-325 MG tablet  Every 6 hours PRN,   Status:  Discontinued        11/10/20 0641    oxyCODONE-acetaminophen (PERCOCET) 5-325 MG tablet  Every 6 hours PRN        11/10/20 0726             Rossanna Spitzley, Barbara Cower, MD 11/11/20 [phone]

## 2020-11-10 NOTE — ED Notes (Signed)
X RAY at bedside 

## 2020-11-10 NOTE — Discharge Instructions (Addendum)
You have been seen and discharged from the emergency department.  Follow-up with your primary provider for reevaluation and further care. Take pain medicine as needed, do not mix this pain medicine with alcohol or other drugs. This pain medicine can be sedating. If you have any worsening symptoms or further concerns for your health please return to an emergency department for further evaluation.

## 2020-11-10 NOTE — ED Notes (Signed)
Urinal provided, pt encouraged to provide sample for ordered specimen.

## 2020-11-11 ENCOUNTER — Inpatient Hospital Stay (HOSPITAL_COMMUNITY)
Admission: EM | Admit: 2020-11-11 | Discharge: 2020-11-15 | DRG: 516 | Disposition: A | Discharge status: Home Health | Payer: BC Managed Care – PPO | Attending: Family Medicine | Admitting: Family Medicine

## 2020-11-11 ENCOUNTER — Other Ambulatory Visit: Payer: Self-pay

## 2020-11-11 ENCOUNTER — Encounter (HOSPITAL_COMMUNITY): Payer: Self-pay | Admitting: *Deleted

## 2020-11-11 DIAGNOSIS — Z825 Family history of asthma and other chronic lower respiratory diseases: Secondary | ICD-10-CM

## 2020-11-11 DIAGNOSIS — S22089A Unspecified fracture of T11-T12 vertebra, initial encounter for closed fracture: Secondary | ICD-10-CM | POA: Diagnosis present

## 2020-11-11 DIAGNOSIS — W19XXXA Unspecified fall, initial encounter: Secondary | ICD-10-CM | POA: Diagnosis present

## 2020-11-11 DIAGNOSIS — S32000A Wedge compression fracture of unspecified lumbar vertebra, initial encounter for closed fracture: Secondary | ICD-10-CM | POA: Diagnosis not present

## 2020-11-11 DIAGNOSIS — S32020D Wedge compression fracture of second lumbar vertebra, subsequent encounter for fracture with routine healing: Secondary | ICD-10-CM

## 2020-11-11 DIAGNOSIS — Z803 Family history of malignant neoplasm of breast: Secondary | ICD-10-CM

## 2020-11-11 DIAGNOSIS — S32029A Unspecified fracture of second lumbar vertebra, initial encounter for closed fracture: Secondary | ICD-10-CM | POA: Diagnosis present

## 2020-11-11 DIAGNOSIS — M5126 Other intervertebral disc displacement, lumbar region: Secondary | ICD-10-CM | POA: Diagnosis present

## 2020-11-11 DIAGNOSIS — Z7401 Bed confinement status: Secondary | ICD-10-CM

## 2020-11-11 DIAGNOSIS — G40909 Epilepsy, unspecified, not intractable, without status epilepticus: Secondary | ICD-10-CM | POA: Diagnosis present

## 2020-11-11 DIAGNOSIS — Z20822 Contact with and (suspected) exposure to covid-19: Secondary | ICD-10-CM | POA: Diagnosis present

## 2020-11-11 DIAGNOSIS — R569 Unspecified convulsions: Secondary | ICD-10-CM | POA: Diagnosis present

## 2020-11-11 DIAGNOSIS — S22080D Wedge compression fracture of T11-T12 vertebra, subsequent encounter for fracture with routine healing: Secondary | ICD-10-CM

## 2020-11-11 DIAGNOSIS — F1220 Cannabis dependence, uncomplicated: Secondary | ICD-10-CM | POA: Diagnosis present

## 2020-11-11 DIAGNOSIS — Z8349 Family history of other endocrine, nutritional and metabolic diseases: Secondary | ICD-10-CM

## 2020-11-11 DIAGNOSIS — Z8249 Family history of ischemic heart disease and other diseases of the circulatory system: Secondary | ICD-10-CM | POA: Diagnosis not present

## 2020-11-11 DIAGNOSIS — S32000S Wedge compression fracture of unspecified lumbar vertebra, sequela: Secondary | ICD-10-CM | POA: Diagnosis not present

## 2020-11-11 HISTORY — DX: Unspecified convulsions: R56.9

## 2020-11-11 LAB — CBC WITH DIFFERENTIAL/PLATELET
Abs Immature Granulocytes: 0.07 10*3/uL (ref 0.00–0.07)
Basophils Absolute: 0.1 10*3/uL (ref 0.0–0.1)
Basophils Relative: 0 %
Eosinophils Absolute: 0 10*3/uL (ref 0.0–0.5)
Eosinophils Relative: 0 %
HCT: 41.1 % (ref 39.0–52.0)
Hemoglobin: 13.8 g/dL (ref 13.0–17.0)
Immature Granulocytes: 1 %
Lymphocytes Relative: 9 %
Lymphs Abs: 1.1 10*3/uL (ref 0.7–4.0)
MCH: 32.5 pg (ref 26.0–34.0)
MCHC: 33.6 g/dL (ref 30.0–36.0)
MCV: 96.9 fL (ref 80.0–100.0)
Monocytes Absolute: 0.7 10*3/uL (ref 0.1–1.0)
Monocytes Relative: 6 %
Neutro Abs: 10 10*3/uL — ABNORMAL HIGH (ref 1.7–7.7)
Neutrophils Relative %: 84 %
Platelets: 148 10*3/uL — ABNORMAL LOW (ref 150–400)
RBC: 4.24 MIL/uL (ref 4.22–5.81)
RDW: 12.5 % (ref 11.5–15.5)
WBC: 12 10*3/uL — ABNORMAL HIGH (ref 4.0–10.5)
nRBC: 0 % (ref 0.0–0.2)

## 2020-11-11 LAB — BASIC METABOLIC PANEL
Anion gap: 6 (ref 5–15)
BUN: 12 mg/dL (ref 6–20)
CO2: 25 mmol/L (ref 22–32)
Calcium: 8.8 mg/dL — ABNORMAL LOW (ref 8.9–10.3)
Chloride: 105 mmol/L (ref 98–111)
Creatinine, Ser: 1.05 mg/dL (ref 0.61–1.24)
GFR, Estimated: >60 mL/min (ref >60)
Glucose, Bld: 103 mg/dL — ABNORMAL HIGH (ref 70–99)
Potassium: 4 mmol/L (ref 3.5–5.1)
Sodium: 136 mmol/L (ref 135–145)

## 2020-11-11 LAB — RESP PANEL BY RT-PCR (FLU A&B, COVID) ARPGX2
Influenza A by PCR: NEGATIVE
Influenza B by PCR: NEGATIVE
SARS Coronavirus 2 by RT PCR: NEGATIVE

## 2020-11-11 LAB — HIV ANTIBODY (ROUTINE TESTING W REFLEX): HIV Screen 4th Generation wRfx: NONREACTIVE

## 2020-11-11 MED ORDER — HYDROCODONE-ACETAMINOPHEN 7.5-325 MG PO TABS
2.0000 | ORAL_TABLET | Freq: Four times a day (QID) | ORAL | Status: DC | PRN
  Administered 2020-11-11 – 2020-11-13 (×4): 2 via ORAL
  Filled 2020-11-11 (×4): qty 2

## 2020-11-11 MED ORDER — ACETAMINOPHEN 650 MG RE SUPP: 650.0000 mg | RECTAL | Status: DC | PRN

## 2020-11-11 MED ORDER — ACETAMINOPHEN 325 MG PO TABS
650.0000 mg | ORAL_TABLET | ORAL | Status: DC | PRN
  Administered 2020-11-13 – 2020-11-14 (×2): 650 mg via ORAL
  Filled 2020-11-11 (×2): qty 2

## 2020-11-11 MED ORDER — ONDANSETRON HCL 4 MG PO TABS: 4.0000 mg | ORAL_TABLET | Freq: Four times a day (QID) | ORAL | Status: DC | PRN

## 2020-11-11 MED ORDER — LEVETIRACETAM 500 MG PO TABS
500.0000 mg | ORAL_TABLET | Freq: Two times a day (BID) | ORAL | Status: DC
  Administered 2020-11-11 – 2020-11-15 (×8): 500 mg via ORAL
  Filled 2020-11-11 (×8): qty 1

## 2020-11-11 MED ORDER — MORPHINE SULFATE (PF) 4 MG/ML IV SOLN
4.0000 mg | Freq: Once | INTRAVENOUS | Status: AC
  Administered 2020-11-11: 4 mg via INTRAVENOUS
  Filled 2020-11-11: qty 1

## 2020-11-11 MED ORDER — LORAZEPAM 2 MG/ML IJ SOLN: 1.0000 mg | INTRAMUSCULAR | Status: DC | PRN

## 2020-11-11 MED ORDER — MORPHINE SULFATE (PF) 2 MG/ML IV SOLN
2.0000 mg | INTRAVENOUS | Status: DC | PRN
  Administered 2020-11-11: 2 mg via INTRAVENOUS
  Filled 2020-11-11: qty 1

## 2020-11-11 MED ORDER — ONDANSETRON HCL 4 MG/2ML IJ SOLN
4.0000 mg | Freq: Once | INTRAMUSCULAR | Status: AC
  Administered 2020-11-11: 4 mg via INTRAVENOUS
  Filled 2020-11-11: qty 2

## 2020-11-11 MED ORDER — POLYETHYLENE GLYCOL 3350 17 G PO PACK: 17.0000 g | PACK | Freq: Every day | ORAL | Status: DC | PRN

## 2020-11-11 MED ORDER — LEVETIRACETAM IN NACL 500 MG/100ML IV SOLN
500.0000 mg | Freq: Once | INTRAVENOUS | Status: AC
  Administered 2020-11-11: 500 mg via INTRAVENOUS
  Filled 2020-11-11: qty 100

## 2020-11-11 MED ORDER — LIDOCAINE 5 % EX PTCH
1.0000 | MEDICATED_PATCH | CUTANEOUS | Status: DC
  Administered 2020-11-11 – 2020-11-14 (×3): 1 via TRANSDERMAL
  Filled 2020-11-11 (×3): qty 1

## 2020-11-11 MED ORDER — FAMOTIDINE 20 MG PO TABS
20.0000 mg | ORAL_TABLET | Freq: Once | ORAL | Status: AC
  Administered 2020-11-11: 20 mg via ORAL
  Filled 2020-11-11: qty 1

## 2020-11-11 MED ORDER — DOCUSATE SODIUM 100 MG PO CAPS
100.0000 mg | ORAL_CAPSULE | Freq: Two times a day (BID) | ORAL | Status: DC
  Administered 2020-11-11 – 2020-11-15 (×5): 100 mg via ORAL
  Filled 2020-11-11 (×6): qty 1

## 2020-11-11 MED ORDER — METHOCARBAMOL 1000 MG/10ML IJ SOLN
500.0000 mg | Freq: Four times a day (QID) | INTRAVENOUS | Status: DC | PRN
  Administered 2020-11-11 – 2020-11-14 (×9): 500 mg via INTRAVENOUS
  Filled 2020-11-11: qty 500
  Filled 2020-11-11 (×2): qty 5
  Filled 2020-11-11 (×2): qty 500
  Filled 2020-11-11 (×8): qty 5

## 2020-11-11 MED ORDER — ONDANSETRON HCL 4 MG/2ML IJ SOLN
4.0000 mg | Freq: Four times a day (QID) | INTRAMUSCULAR | Status: DC | PRN
  Administered 2020-11-11: 4 mg via INTRAVENOUS
  Filled 2020-11-11: qty 2

## 2020-11-11 MED ORDER — METHOCARBAMOL 1000 MG/10ML IJ SOLN
1000.0000 mg | Freq: Once | INTRAVENOUS | Status: AC
  Administered 2020-11-11: 1000 mg via INTRAVENOUS
  Filled 2020-11-11: qty 10

## 2020-11-11 MED ORDER — ALUM & MAG HYDROXIDE-SIMETH 200-200-20 MG/5ML PO SUSP
30.0000 mL | Freq: Once | ORAL | Status: AC
  Administered 2020-11-11: 30 mL via ORAL
  Filled 2020-11-11: qty 30

## 2020-11-11 NOTE — ED Notes (Signed)
Family updated as to patient's status.

## 2020-11-11 NOTE — ED Notes (Signed)
Awaiting neurosurgery consult

## 2020-11-11 NOTE — H&P (Signed)
History and Physical    Ricardo Stout NAT:[id number] DOB: 08/08/1960 DOA: 11/11/2020  PCP: Jettie Pagan, NP Consultants:  Marjory Lies - neurology Patient coming from:  Home - lives with wife and children; NOK: Wife, Amman Bartel, [phone]   Chief Complaint: Back pain  HPI: Ricardo Stout is a 60 y.o. male with medical history significant of seizures presenting with back pain following recent seizure.  He was initially seen on 5/11 for episodes of "feeling off."  His wife describes them as episodes where he feels like he is having an out of body experience - he is awake and alert but feels disconnected from his body.  He was referred to neurology and was seen by Dr. Marjory Lies on 6/6; MRI and EEG were ordered.  He was seen in the ER again on 7/10 early AM for frank tonic-clonic seizure activity with unresponsiveness, "foaming at the mouth:, and urinary incontinence.  After the seizure, he developed acute onset of back pain.  At the time of his ER evaluation yesterday early AM, he was able to ambulate or at least transfer.   CT of the back showed acute compression fractures of T12 and L2, possibly also L1.  He also had disc protrusion at L3-4 and L4-5 with a possible small extruded disc fragment.   He also had a normal brain MRI.  A TLSO brace was applied and he was discharged to home.  However, he had another witnessed seizure and returned yesterday afternoon.  Once again he had tonic-clonic activity and urinary incontinence.  He returned to the ER and this time was unable to ambulate or transfer due to the back pain and so was transported home via ambulance.  He was so uncomfortable and unable to get OOB and so his wife brought him in again for evaluation.  No further seizures.  He has never had a PSA or prostate testing.    ED Course: Late Sat-Sun for first time seizure - had prior "spells" but no diagnosed seizures.  Compression fractures, discharged with TLSO brace.  Had more seizures, came  back - loaded with Keppra and discharged.  No further seizures, but unable to control pain.  No new neurologic deficits but severe pain with movement.  Neurosurgery will see.  Given IV Keppra to ensure he can take, Dr. Iver Nestle recommends outpatient f/u unless further episodes.  Review of Systems: As per HPI; otherwise review of systems reviewed and negative.   Ambulatory Status:  Ambulates without assistance   Past Medical History:  Diagnosis Date   Seizures (HCC)     Past Surgical History:  Procedure Laterality Date   HAND SURGERY Right     Social History   Socioeconomic History   Marital status: Married    Spouse name: [name]   Number of children: 1   Years of education: Not on file   Highest education level: Not on file  Occupational History   Not on file  Tobacco Use   Smoking status: Never   Smokeless tobacco: Never  Vaping Use   Vaping Use: Never used  Substance and Sexual Activity   Alcohol use: Yes    Alcohol/week: 2.0 standard drinks    Types: 2 Cans of beer per week    Comment: or less   Drug use: Yes    Types: Marijuana    Comment: 10/07/20 daily   Sexual activity: Not on file  Other Topics Concern   Not on file  Social History Narrative   Lives with wife   '  caffeine- maybe 1 soda a day   Social Determinants of Health   Financial Resource Strain: Not on file  Food Insecurity: Not on file  Transportation Needs: Not on file  Physical Activity: Not on file  Stress: Not on file  Social Connections: Not on file  Intimate Partner Violence: Not on file    No Known Allergies  Family History  Problem Relation Age of Onset   Thyroid disease Mother    Cancer Father    Heart attack Father    COPD Father    Breast cancer Sister    Hypertension Sister    Seizures Maternal Grandmother     Prior to Admission medications   Medication Sig Start Date End Date Taking? Authorizing Provider  ibuprofen (ADVIL) 200 MG tablet Take 200 mg by mouth every 6 (six)  hours as needed (pain).   Yes [provider]  oxyCODONE-acetaminophen (PERCOCET) 5-325 MG tablet Take 1 tablet by mouth every 6 (six) hours as needed for up to 5 days for severe pain. 11/10/20 11/15/20 Yes Horton, Clabe SealKristie M, DO  levETIRAcetam (KEPPRA) 500 MG tablet Take 1 tablet (500 mg total) by mouth 2 (two) times daily. 11/10/20   Gerhard MunchLockwood, Robert, MD  ondansetron (ZOFRAN ODT) 4 MG disintegrating tablet Take 1 tablet (4 mg total) by mouth every 8 (eight) hours as needed for nausea or vomiting. 11/10/20   Gerhard MunchLockwood, Robert, MD    Physical Exam: Vitals:   11/11/20 0926 11/11/20 0928 11/11/20 1423 11/11/20 1708  BP: 122/68  108/66 110/64  Pulse: 69  70 71  Resp: 20  17 18   Temp: 99 F (37.2 C)  98.6 F (37 C) 98.5 F (36.9 C)  TempSrc: Oral  Oral Oral  SpO2: 100%  92% 93%  Weight:  79.4 kg    Height:  5\' 10"  (1.778 m)       General:  Appears calm but with periodic severe discomfort that he reports is terrible whenever he needs to have flatulence Eyes:   EOMI, normal lids, iris ENT:  grossly normal hearing, lips & tongue, mmm; appropriate dentition Neck:  no LAD, masses or thyromegaly Cardiovascular:  RRR, no m/r/g. No LE edema.  Respiratory:   CTA bilaterally with no wheezes/rales/rhonchi.  Normal respiratory effort. Abdomen:  soft, NT, ND Back: TLSO in place Skin:  no rash or induration seen on limited exam Musculoskeletal:  grossly normal tone BUE/BLE, good ROM, no bony abnormality Psychiatric:  grossly normal mood and affect, speech fluent and appropriate, AOx3 Neurologic:  CN 2-12 grossly intact, moves all extremities in coordinated fashion    Radiological Exams on Admission: Independently reviewed - see discussion in A/P where applicable  CT Head Wo Contrast  Result Date: 11/10/2020 CLINICAL DATA:  60 year old male with dizziness. EXAM: CT HEAD WITHOUT CONTRAST TECHNIQUE: Contiguous axial images were obtained from the base of the skull through the vertex without  intravenous contrast. COMPARISON:  None. FINDINGS: Brain: The ventricles and sulci appropriate size for patient's age. The gray-white matter discrimination is preserved. There is no acute intracranial hemorrhage. No mass effect or midline shift. No extra-axial fluid collection. Vascular: No hyperdense vessel or unexpected calcification. Skull: Normal. Negative for fracture or focal lesion. Sinuses/Orbits: There is mucoperiosteal thickening of paranasal sinuses with opacification of several ethmoid air cells. The mastoid air cells are clear. Air-fluid level. Other: None IMPRESSION: 1. Unremarkable noncontrast CT of the brain. 2. Paranasal sinus disease. Electronically Signed   By: Elgie CollardArash  Radparvar M.D.   On: 11/10/2020 03:21  CT Lumbar Spine Wo Contrast  Result Date: 11/10/2020 CLINICAL DATA:  Initial evaluation for low back pain, trauma. EXAM: CT LUMBAR SPINE WITHOUT CONTRAST TECHNIQUE: Multidetector CT imaging of the lumbar spine was performed without intravenous contrast administration. Multiplanar CT image reconstructions were also generated. COMPARISON:  None. FINDINGS: Segmentation: Standard. Lowest well-formed disc space labeled the L5-S1 level. Alignment: Physiologic with preservation of the normal lumbar lordosis. No listhesis. Vertebrae: Mild compression deformity involving the superior endplate of T11 with no more than mild 10% central height loss but no bony retropulsion, age indeterminate. Acute burst type compression fracture involving the T12 vertebral body with up to 35% height loss and 4 mm bony retropulsion. Suspected additional subtle acute compression fracture involving the superior endplate of L1 with minimal height loss but no bony retropulsion. Acute burst type compression fracture involving the superior endplate of L2 with up to 25% height loss with trace 2 mm bony retropulsion. Compression deformity involving the inferior endplate of L4 with up to 35% height loss without bony  retropulsion, chronic in appearance. No other acute fracture. Visualized sacrum and pelvis intact. No discrete osseous lesions. Paraspinal and other soft tissues: Mild paraspinous edema adjacent to the acute compression fractures. Paraspinous soft tissues demonstrate no other acute finding. Mild aorto bi-iliac atherosclerotic disease. Disc levels: T11-12: 4 mm bony retropulsion related to the acute T12 compression fracture. No significant spinal stenosis. Foramina remain patent. T12-L1: Unremarkable. L1-2: Trace 2 mm bony retropulsion related to the L2 compression fracture. No significant spinal stenosis. Foramina remain patent. L2-3:  Negative interspace.  Mild facet hypertrophy.  No stenosis. L3-4: Mild disc bulge. Superimposed left foraminal disc protrusion contacts the exiting left L3 nerve root (series 3, image 102). Mild facet hypertrophy. No spinal stenosis. Mild left foraminal narrowing. L4-5: Mild disc bulge. Superimposed broad base right subarticular to foraminal disc protrusion, potentially affecting either the exiting right L4 or descending L5 nerve roots (series 3, image 118). Mild facet hypertrophy. No significant spinal stenosis. Mild left with mild to moderate right L4 foraminal narrowing. L5-S1: Mild disc bulge, eccentric to the left. 8 mm soft tissue density at the level of the left neural foramen likely reflects a small extruded disc fragment (series 3, image 133). This closely approximates and could potentially affect the exiting left L5 nerve root. Mild facet hypertrophy. No significant spinal stenosis. Mild left foraminal narrowing. IMPRESSION: 1. Acute compression fractures involving the T12 and L2 vertebral bodies, with additional possible subtle compression fracture involving the superior endplate of L1 as above. 2. Mild compression deformity involving the superior endplate of T11, age indeterminate, and could be chronic in nature. 3. Mild compression deformity involving the inferior  endplate of L4, likely chronic. 4. Left foraminal disc protrusion at L3-4, contacting and potentially affecting the exiting left L3 nerve root. 5. Broad right subarticular to foraminal disc protrusion at L4-5, potentially affecting either the exiting right L4 or descending L5 nerve roots. 6. 8 mm soft tissue density at the level of the left neural foramen at L5-S1, likely a small extruded disc fragment. This closely approximates the exiting left L5 nerve root. Electronically Signed   By: Rise Mu M.D.   On: 11/10/2020 04:04   MR BRAIN W WO CONTRAST  Result Date: 11/10/2020 CLINICAL DATA:  Seizure-like activity. Chronic headache. Neuro deficit with stroke suspected. EXAM: MRI HEAD WITHOUT AND WITH CONTRAST TECHNIQUE: Multiplanar, multiecho pulse sequences of the brain and surrounding structures were obtained without and with intravenous contrast. CONTRAST:  7.9mL GADAVIST GADOBUTROL  1 MMOL/ML IV SOLN COMPARISON:  Head CT from earlier today FINDINGS: Brain: No acute infarction, hemorrhage, hydrocephalus, extra-axial collection or mass lesion. No cortical finding to explain seizure. No abnormal enhancement. Vascular: Normal flow voids. Skull and upper cervical spine: Normal marrow signal. Sinuses/Orbits: Mild patchy opacification of paranasal sinuses and left more than right mastoids. Other: Mild motion artifact IMPRESSION: Normal MRI of the brain. Electronically Signed   By: Marnee Spring M.D.   On: 11/10/2020 05:39   DG Chest Portable 1 View  Result Date: 11/10/2020 CLINICAL DATA:  Recent seizure-like activity EXAM: PORTABLE CHEST 1 VIEW COMPARISON:  None. FINDINGS: Cardiac shadow is within normal limits. The lungs are well aerated bilaterally. Some patchy airspace opacity is noted in the right upper lobe which may represent some early infiltrate. No bony abnormality is seen. IMPRESSION: Patchy airspace opacity in the right upper lobe suggestive of early infiltrate. Electronically Signed   By:  Alcide Clever M.D.   On: 11/10/2020 03:00   DG Shoulder Left  Result Date: 11/10/2020 CLINICAL DATA:  Recent seizure-like activity with shoulder pain, initial encounter EXAM: LEFT SHOULDER - 2+ VIEW COMPARISON:  None. FINDINGS: Degenerative changes of the acromioclavicular joint are seen. No acute fracture or dislocation is seen. The underlying bony thorax is within normal limits. No soft tissue abnormality is seen. IMPRESSION: Degenerative change without acute abnormality. Electronically Signed   By: Alcide Clever M.D.   On: 11/10/2020 02:59   DG Shoulder Left Portable  Result Date: 11/10/2020 CLINICAL DATA:  Left shoulder pain post suture. EXAM: LEFT SHOULDER COMPARISON:  None. FINDINGS: Minimal degenerative changes of the [facility] joint and glenohumeral joints. No acute fracture or dislocation. IMPRESSION: 1. No acute findings. 2. Mild degenerative changes. Electronically Signed   By: Elberta Fortis M.D.   On: 11/10/2020 16:38   DG Shoulder Right Portable  Result Date: 11/10/2020 CLINICAL DATA:  Right shoulder pain post seizure. EXAM: PORTABLE RIGHT SHOULDER COMPARISON:  None. FINDINGS: Minimal degenerative changes of the [facility] joint and glenohumeral joint. No acute fracture or dislocation. IMPRESSION: No acute findings. Electronically Signed   By: Elberta Fortis M.D.   On: 11/10/2020 16:37    EKG: Independently reviewed.  NSR with rate 83; nonspecific ST changes with NSCSLT   Labs on Admission: I have personally reviewed the available labs and imaging studies at the time of the admission.  Pertinent labs:   Unremarkable BMP WBC 12.0 Platelets 148 COVID/flu negative   Assessment/Plan Principal Problem:   Compression fracture of lumbar vertebra (HCC) Active Problems:   Seizure disorder (HCC)   Marijuana dependence (HCC)   Compression fracture -?Seizure activity resulting in multiple compression fractures; also with disc herniations -IR consult in AM for kyphoplasty -Neurosurgery consult to  see if further intervention is needed -Will not make patient NPO at this time since insurance authorization is usually required and this can take several days -Kyphoplasty is indicated for patients with a >15% height loss with fracture in <3 months as well as for acute, intractable pain associated with fracture -SCDs for DVT prophylaxis for now -Pain control with morphine, robaxin, Norco, and lidocaine patch pre-procedure -May need PT evaluation post-procedure but currently unable to participate due to pain -Given the unusual nature of his complaint, will check PSA (counseled regarding risks/benefits and patient/wife agree)  Seizure -Several months of apparent aura episodes, with acute onset of seizures starting 2 days ago (2 seizures) -He has been loaded with Keppra, if no further seizure activity while hospitalized then neurology  recommends outpatient f/u -Continue Keppra for now -We discussed avoidance of medications that lower the seizure threshold  Marijuana dependence -Cessation encouraged; this should be encouraged on an ongoing basis -UDS ordered      Note: This patient has been tested and is negative for the novel coronavirus COVID-19.   Level of care: Med-Surg DVT prophylaxis: SCDs Code Status:  Full - confirmed with patient/family Family Communication: Wife present throughout evaluation Disposition Plan:  The patient is from: home  Anticipated d/c is to: home without [facility] services   Anticipated d/c date will depend on clinical response to treatment, likely several days  Patient is currently: acutely ill Consults called: Neurosurgery; IR; will need PT/OT  Admission status:  Admit - It is my clinical opinion that admission to INPATIENT is reasonable and necessary because of the expectation that this patient will require hospital care that crosses at least 2 midnights to treat this condition based on the medical complexity of the problems presented.  Given the aforementioned  information, the predictability of an adverse outcome is felt to be significant.    Jonah Blue MD [facility]   How to contact the [facility] Attending or Consulting provider 7A - 7P or covering provider during after hours 7P -7A, for this patient?  Check the care team in [facility] and look for a) attending/consulting [facility] provider listed and b) the [facility] team listed Log into [email or web address] and use [place]'s universal password to access. If you do not have the password, please contact the hospital operator. Locate the [facility] provider you are looking for under [facility] and page to a number that you can be directly reached. If you still have difficulty reaching the provider, please page the [facility] (Director on Call) for the Hospitalists listed on amion for assistance.   11/11/2020, 6:03 PM

## 2020-11-11 NOTE — ED Triage Notes (Signed)
Patient States he was seen in the ED Sat pm after having a seizure 1st time, was dc'd from ED and returned yest at 1 pm for for same, today presents c/o back pain states he isn't able to get up very pain with any movement

## 2020-11-11 NOTE — Consult Note (Signed)
I was contacted and asked to opine on this patient's T12 and L2 fractures.   I have reviewed his lumbar CT performed at [facility] on 11/10/2020.  He has mild T12 and L2 compression fractures.  The should heal fine in a TLSO.   He should wear the TLSO at all times while he is sitting greater than 45 or out of bed.  He can take the TLSO off for showering but avoid bending and twisting in the shower. Please have the patient follow up in my office in a few weeks.  Please call if I can be of further assistance.

## 2020-11-11 NOTE — Progress Notes (Signed)
Orthopedic Tech Progress Note Patient Details:  Ricardo Stout Sep 30, 1960 [id number]  Patient has on TLSO BRACE  Patient ID: Ricardo Stout, male   DOB: 1960-05-25, 60 y.o.   MRN: [id number]  [name] 11/11/2020, 6:14 PM

## 2020-11-11 NOTE — ED Provider Notes (Signed)
[facility] EMERGENCY DEPARTMENT Provider Note   CSN: [id number] Arrival date & time: 11/11/20  6222     History Chief Complaint  Patient presents with   Back Pain    Ricardo Stout is a 60 y.o. male.  Ricardo Stout is a 60 y.o. male with hx of seizures and recently diagnosed compression fractures, who presents for evaluation of worsening back pain. Pt initially seen in ED around 4 AM on 7/10 after pt has witnessed seizure like activity and was noted to have back pain after seizure, found to have compression fractures of T12 and L2. Pt was placed in TLSO brace and discharged with pain medication and plan for outpatient neurosurgery follow up. Pt return to ED on evening of 7/10 after he had a second episode of seizure like activity, neuro was consulted and pt started on Keppra, given loading dose and discharged home. Returns today with severe uncontrollable low back pain. Unable to get comfortable or more around at home, pain worse with any movement. Tried taking percocet as prescribed, but this made him nauseated and he could not keep down any additional pain meds. Was prescribed zofran yesterday evening, but pharmacy was closed and he was unable to fill this. Pt has not had his morning keppra dose today either. No additional seizure episodes, no new trauma or injury to back.  The history is provided by the patient and the spouse.        Past Medical History:  Diagnosis Date   Seizures (HCC)     There are no problems to display for this patient.   Past Surgical History:  Procedure Laterality Date   HAND SURGERY Right        Family History  Problem Relation Age of Onset   Thyroid disease Mother    Cancer Father    Heart attack Father    COPD Father    Breast cancer Sister    Hypertension Sister     Social History   Tobacco Use   Smoking status: Never   Smokeless tobacco: Never  Vaping Use   Vaping Use: Never used  Substance Use Topics    Alcohol use: Yes    Alcohol/week: 2.0 standard drinks    Types: 2 Cans of beer per week    Comment: or less   Drug use: Yes    Types: Marijuana    Comment: 10/07/20 daily    Home Medications Prior to Admission medications   Medication Sig Start Date End Date Taking? Authorizing Provider  levETIRAcetam (KEPPRA) 500 MG tablet Take 1 tablet (500 mg total) by mouth 2 (two) times daily. 11/10/20   Gerhard Munch, MD  ondansetron (ZOFRAN ODT) 4 MG disintegrating tablet Take 1 tablet (4 mg total) by mouth every 8 (eight) hours as needed for nausea or vomiting. 11/10/20   Gerhard Munch, MD  oxyCODONE-acetaminophen (PERCOCET) 5-325 MG tablet Take 1 tablet by mouth every 6 (six) hours as needed for up to 5 days for severe pain. 11/10/20 11/15/20  Horton, Clabe Seal, DO    Allergies    Patient has no known allergies.  Review of Systems   Review of Systems  Constitutional:  Negative for chills and fever.  Respiratory:  Negative for shortness of breath.   Cardiovascular:  Negative for chest pain.  Gastrointestinal:  Positive for nausea and vomiting. Negative for abdominal pain.  Genitourinary:  Negative for difficulty urinating and dysuria.  Musculoskeletal:  Positive for back pain. Negative for arthralgias and myalgias.  Skin:  Negative for color change and rash.  Neurological:  Negative for seizures, weakness, numbness and headaches.  All other systems reviewed and are negative.  Physical Exam Updated Vital Signs BP 122/68 (BP Location: Left Arm)   Pulse 69   Temp 99 F (37.2 C) (Oral)   Resp 20   Ht  (1.778 m)   Wt 79.4 kg   SpO2 100%   BMI 25.11 kg/m   Physical Exam Vitals and nursing note reviewed.  Constitutional:      General: He is not in acute distress.    Appearance: Normal appearance. He is well-developed. He is not diaphoretic.     Comments: Pt very uncomfortable, yells out in pain with any movement  HENT:     Head: Normocephalic and atraumatic.  Eyes:      General:        Right eye: No discharge.        Left eye: No discharge.     Extraocular Movements: Extraocular movements intact.     Pupils: Pupils are equal, round, and reactive to light.  Cardiovascular:     Rate and Rhythm: Normal rate and regular rhythm.     Pulses: Normal pulses.     Heart sounds: Normal heart sounds.  Pulmonary:     Effort: Pulmonary effort is normal. No respiratory distress.     Breath sounds: Normal breath sounds. No wheezing or rales.     Comments: Respirations equal and unlabored, patient able to speak in full sentences, lungs clear to auscultation bilaterally  Abdominal:     General: Bowel sounds are normal. There is no distension.     Palpations: Abdomen is soft. There is no mass.     Tenderness: There is no abdominal tenderness. There is no guarding.     Comments: Abdomen soft, nondistended, nontender to palpation in all quadrants without guarding or peritoneal signs  Musculoskeletal:        General: Tenderness present. No deformity.     Cervical back: Neck supple.     Comments: Tenderness of midline lower thoracic and lumbar spine, TLSO brace in place  Skin:    General: Skin is warm and dry.     Capillary Refill: Capillary refill takes less than 2 seconds.  Neurological:     Mental Status: He is alert and oriented to person, place, and time.     Coordination: Coordination normal.     Comments: Speech is clear, able to follow commands CN III-XII intact Normal strength in upper and lower extremities bilaterally including dorsiflexion and plantar flexion, strong and equal grip strength, pain with any movement of lower extremities limits exam Sensation normal to light and sharp touch Moves extremities without ataxia, coordination intact  Psychiatric:        Mood and Affect: Mood normal.        Behavior: Behavior normal.    ED Results / Procedures / Treatments   Labs (all labs ordered are listed, but only abnormal results are displayed) Labs Reviewed   BASIC METABOLIC PANEL - Abnormal; Notable for the following components:      Result Value   Glucose, Bld 103 (*)    Calcium 8.8 (*)    All other components within normal limits  CBC WITH DIFFERENTIAL/PLATELET - Abnormal; Notable for the following components:   WBC 12.0 (*)    Platelets 148 (*)    Neutro Abs 10.0 (*)    All other components within normal limits  CBC - Abnormal;  Notable for the following components:   RBC 4.04 (*)    HCT 38.6 (*)    All other components within normal limits  BASIC METABOLIC PANEL - Abnormal; Notable for the following components:   Calcium 8.7 (*)    Anion gap 4 (*)    All other components within normal limits  RESP PANEL BY RT-PCR (FLU A&B, COVID) ARPGX2  HIV ANTIBODY (ROUTINE TESTING W REFLEX)  PSA (REFLEX TO FREE) (SERIAL)  PROTIME-INR  RAPID URINE DRUG SCREEN, HOSP PERFORMED  POTASSIUM  CBC WITH DIFFERENTIAL/PLATELET    EKG None  Radiology CT Head Wo Contrast  Result Date: 11/10/2020 CLINICAL DATA:  60 year old male with dizziness. EXAM: CT HEAD WITHOUT CONTRAST TECHNIQUE: Contiguous axial images were obtained from the base of the skull through the vertex without intravenous contrast. COMPARISON:  None. FINDINGS: Brain: The ventricles and sulci appropriate size for patient's age. The gray-white matter discrimination is preserved. There is no acute intracranial hemorrhage. No mass effect or midline shift. No extra-axial fluid collection. Vascular: No hyperdense vessel or unexpected calcification. Skull: Normal. Negative for fracture or focal lesion. Sinuses/Orbits: There is mucoperiosteal thickening of paranasal sinuses with opacification of several ethmoid air cells. The mastoid air cells are clear. Air-fluid level. Other: None IMPRESSION: 1. Unremarkable noncontrast CT of the brain. 2. Paranasal sinus disease. Electronically Signed   By: Elgie Collard M.D.   On: 11/10/2020 03:21   CT Lumbar Spine Wo Contrast  Result Date:  11/10/2020 CLINICAL DATA:  Initial evaluation for low back pain, trauma. EXAM: CT LUMBAR SPINE WITHOUT CONTRAST TECHNIQUE: Multidetector CT imaging of the lumbar spine was performed without intravenous contrast administration. Multiplanar CT image reconstructions were also generated. COMPARISON:  None. FINDINGS: Segmentation: Standard. Lowest well-formed disc space labeled the L5-S1 level. Alignment: Physiologic with preservation of the normal lumbar lordosis. No listhesis. Vertebrae: Mild compression deformity involving the superior endplate of T11 with no more than mild 10% central height loss but no bony retropulsion, age indeterminate. Acute burst type compression fracture involving the T12 vertebral body with up to 35% height loss and 4 mm bony retropulsion. Suspected additional subtle acute compression fracture involving the superior endplate of L1 with minimal height loss but no bony retropulsion. Acute burst type compression fracture involving the superior endplate of L2 with up to 25% height loss with trace 2 mm bony retropulsion. Compression deformity involving the inferior endplate of L4 with up to 35% height loss without bony retropulsion, chronic in appearance. No other acute fracture. Visualized sacrum and pelvis intact. No discrete osseous lesions. Paraspinal and other soft tissues: Mild paraspinous edema adjacent to the acute compression fractures. Paraspinous soft tissues demonstrate no other acute finding. Mild aorto bi-iliac atherosclerotic disease. Disc levels: T11-12: 4 mm bony retropulsion related to the acute T12 compression fracture. No significant spinal stenosis. Foramina remain patent. T12-L1: Unremarkable. L1-2: Trace 2 mm bony retropulsion related to the L2 compression fracture. No significant spinal stenosis. Foramina remain patent. L2-3:  Negative interspace.  Mild facet hypertrophy.  No stenosis. L3-4: Mild disc bulge. Superimposed left foraminal disc protrusion contacts the exiting  left L3 nerve root (series 3, image 102). Mild facet hypertrophy. No spinal stenosis. Mild left foraminal narrowing. L4-5: Mild disc bulge. Superimposed broad base right subarticular to foraminal disc protrusion, potentially affecting either the exiting right L4 or descending L5 nerve roots (series 3, image 118). Mild facet hypertrophy. No significant spinal stenosis. Mild left with mild to moderate right L4 foraminal narrowing. L5-S1: Mild disc bulge, eccentric to  the left. 8 mm soft tissue density at the level of the left neural foramen likely reflects a small extruded disc fragment (series 3, image 133). This closely approximates and could potentially affect the exiting left L5 nerve root. Mild facet hypertrophy. No significant spinal stenosis. Mild left foraminal narrowing. IMPRESSION: 1. Acute compression fractures involving the T12 and L2 vertebral bodies, with additional possible subtle compression fracture involving the superior endplate of L1 as above. 2. Mild compression deformity involving the superior endplate of T11, age indeterminate, and could be chronic in nature. 3. Mild compression deformity involving the inferior endplate of L4, likely chronic. 4. Left foraminal disc protrusion at L3-4, contacting and potentially affecting the exiting left L3 nerve root. 5. Broad right subarticular to foraminal disc protrusion at L4-5, potentially affecting either the exiting right L4 or descending L5 nerve roots. 6. 8 mm soft tissue density at the level of the left neural foramen at L5-S1, likely a small extruded disc fragment. This closely approximates the exiting left L5 nerve root. Electronically Signed   By: Rise Mu M.D.   On: 11/10/2020 04:04   MR BRAIN W WO CONTRAST  Result Date: 11/10/2020 CLINICAL DATA:  Seizure-like activity. Chronic headache. Neuro deficit with stroke suspected. EXAM: MRI HEAD WITHOUT AND WITH CONTRAST TECHNIQUE: Multiplanar, multiecho pulse sequences of the brain and  surrounding structures were obtained without and with intravenous contrast. CONTRAST:  7.13mL GADAVIST GADOBUTROL 1 MMOL/ML IV SOLN COMPARISON:  Head CT from earlier today FINDINGS: Brain: No acute infarction, hemorrhage, hydrocephalus, extra-axial collection or mass lesion. No cortical finding to explain seizure. No abnormal enhancement. Vascular: Normal flow voids. Skull and upper cervical spine: Normal marrow signal. Sinuses/Orbits: Mild patchy opacification of paranasal sinuses and left more than right mastoids. Other: Mild motion artifact IMPRESSION: Normal MRI of the brain. Electronically Signed   By: Marnee Spring M.D.   On: 11/10/2020 05:39   DG Chest Portable 1 View  Result Date: 11/10/2020 CLINICAL DATA:  Recent seizure-like activity EXAM: PORTABLE CHEST 1 VIEW COMPARISON:  None. FINDINGS: Cardiac shadow is within normal limits. The lungs are well aerated bilaterally. Some patchy airspace opacity is noted in the right upper lobe which may represent some early infiltrate. No bony abnormality is seen. IMPRESSION: Patchy airspace opacity in the right upper lobe suggestive of early infiltrate. Electronically Signed   By: Alcide Clever M.D.   On: 11/10/2020 03:00   DG Shoulder Left  Result Date: 11/10/2020 CLINICAL DATA:  Recent seizure-like activity with shoulder pain, initial encounter EXAM: LEFT SHOULDER - 2+ VIEW COMPARISON:  None. FINDINGS: Degenerative changes of the acromioclavicular joint are seen. No acute fracture or dislocation is seen. The underlying bony thorax is within normal limits. No soft tissue abnormality is seen. IMPRESSION: Degenerative change without acute abnormality. Electronically Signed   By: Alcide Clever M.D.   On: 11/10/2020 02:59   DG Shoulder Left Portable  Result Date: 11/10/2020 CLINICAL DATA:  Left shoulder pain post suture. EXAM: LEFT SHOULDER COMPARISON:  None. FINDINGS: Minimal degenerative changes of the [facility] joint and glenohumeral joints. No acute fracture or  dislocation. IMPRESSION: 1. No acute findings. 2. Mild degenerative changes. Electronically Signed   By: Elberta Fortis M.D.   On: 11/10/2020 16:38   DG Shoulder Right Portable  Result Date: 11/10/2020 CLINICAL DATA:  Right shoulder pain post seizure. EXAM: PORTABLE RIGHT SHOULDER COMPARISON:  None. FINDINGS: Minimal degenerative changes of the [facility] joint and glenohumeral joint. No acute fracture or dislocation. IMPRESSION: No acute findings.  Electronically Signed   By: Elberta Fortis M.D.   On: 11/10/2020 16:37    Procedures Procedures   Medications Ordered in ED Medications  levETIRAcetam (KEPPRA) tablet 500 mg (500 mg Oral Given 11/13/20 2209)  LORazepam (ATIVAN) injection 1-2 mg (has no administration in time range)  acetaminophen (TYLENOL) tablet 650 mg (650 mg Oral Given 11/13/20 2015)    Or  acetaminophen (TYLENOL) suppository 650 mg ( Rectal See Alternative 11/13/20 2015)  docusate sodium (COLACE) capsule 100 mg (100 mg Oral Given 11/13/20 2209)  polyethylene glycol (MIRALAX / GLYCOLAX) packet 17 g (has no administration in time range)  ondansetron (ZOFRAN) tablet 4 mg ( Oral See Alternative 11/11/20 1757)    Or  ondansetron (ZOFRAN) injection 4 mg (4 mg Intravenous Given 11/11/20 1757)  methocarbamol (ROBAXIN) 500 mg in dextrose 5 % 50 mL IVPB (500 mg Intravenous New Bag/Given 11/13/20 2212)  lidocaine (LIDODERM) 5 % 1 patch (1 patch Transdermal Patch Removed 11/13/20 0652)  HYDROcodone-acetaminophen (NORCO) 7.5-325 MG per tablet 2 tablet (2 tablets Oral Given 11/13/20 0748)  morphine 2 MG/ML injection 2 mg (2 mg Intravenous Given 11/11/20 1759)  alum & mag hydroxide-simeth (MAALOX/MYLANTA) 200-200-20 MG/5ML suspension 15 mL (15 mLs Oral Given 11/12/20 1417)  pantoprazole (PROTONIX) EC tablet 40 mg (40 mg Oral Given 11/13/20 1005)  calcium carbonate (TUMS - dosed in mg elemental calcium) chewable tablet 200 mg of elemental calcium (has no administration in time range)  hydrocortisone cream 1 % 1  application (1 application Topical Given 11/13/20 1650)  morphine 4 MG/ML injection 4 mg (4 mg Intravenous Given 11/11/20 1002)  ondansetron (ZOFRAN) injection 4 mg (4 mg Intravenous Given 11/11/20 1002)  methocarbamol (ROBAXIN) 1,000 mg in dextrose 5 % 100 mL IVPB (0 mg Intravenous Stopped 11/11/20 1049)  levETIRAcetam (KEPPRA) IVPB 500 mg/100 mL premix (0 mg Intravenous Stopped 11/11/20 1105)  alum & mag hydroxide-simeth (MAALOX/MYLANTA) 200-200-20 MG/5ML suspension 30 mL (30 mLs Oral Given 11/11/20 1208)  famotidine (PEPCID) tablet 20 mg (20 mg Oral Given 11/11/20 1208)  morphine 4 MG/ML injection 4 mg (4 mg Intravenous Given 11/11/20 1430)    ED Course  I have reviewed the triage vital signs and the nursing notes.  Pertinent labs & imaging results that were available during my care of the patient were reviewed by me and considered in my medical decision making (see chart for details).    MDM Rules/Calculators/A&P                         Pt returns to ED unable to control back pain from T12 and L2 compression fractures sustained likely during new onset seizure. Pain meds at home caused N/V, unable to tolerate, has no had seizure med today either. No neuro deficits or new injury to back do not feel repeat imaging is indicated. Will give meds for pain control and reassess.   Despite multiple meds to treat pain pt continues to have severe pain with any movement at all, unable to get uo or ambulate. Will consult neurosurgery and admit for pain control.  Case discussed with NP Hildred Priest, will see pt in consult and provide recommendations.  Dr. Iver Nestle with neurology recommends continuing keppra and outpatient follow up as planned unless pt has any additional seizures.  Case discussed with Dr. Ophelia Charter who will see and admit patient.  Final Clinical Impression(s) / ED Diagnoses Final diagnoses:  Compression fracture of T12 vertebra with routine healing, subsequent encounter  Closed compression  fracture of L2 lumbar vertebra with routine healing, subsequent encounter  Seizures [facility])    Rx / DC Orders ED Discharge Orders     None        Legrand Rams 11/14/20 0315    Cathren Laine, MD 11/16/20 1539

## 2020-11-11 NOTE — ED Notes (Signed)
Patient is resting comfortably. 

## 2020-11-11 NOTE — Progress Notes (Signed)
Patient admitted to room. Alert and oriented x4. Skin dry and warm to touch without any issues. Pain of lower back. PRN med given. Oriented to room and staffs.

## 2020-11-11 NOTE — ED Notes (Signed)
Nurse report given to Cyndia Bent, RN

## 2020-11-11 NOTE — Plan of Care (Signed)
  Problem: Activity: Goal: Risk for activity intolerance will decrease Outcome: Progressing   Problem: Pain Managment: Goal: General experience of comfort will improve Outcome: Progressing   Problem: Safety: Goal: Ability to remain free from injury will improve Outcome: Progressing   

## 2020-11-12 ENCOUNTER — Encounter (HOSPITAL_COMMUNITY): Payer: Self-pay | Admitting: Internal Medicine

## 2020-11-12 ENCOUNTER — Inpatient Hospital Stay (HOSPITAL_COMMUNITY): Payer: BC Managed Care – PPO

## 2020-11-12 DIAGNOSIS — S32020D Wedge compression fracture of second lumbar vertebra, subsequent encounter for fracture with routine healing: Secondary | ICD-10-CM

## 2020-11-12 DIAGNOSIS — S22080D Wedge compression fracture of T11-T12 vertebra, subsequent encounter for fracture with routine healing: Secondary | ICD-10-CM

## 2020-11-12 DIAGNOSIS — G40909 Epilepsy, unspecified, not intractable, without status epilepticus: Secondary | ICD-10-CM

## 2020-11-12 LAB — BASIC METABOLIC PANEL
Anion gap: 4 — ABNORMAL LOW (ref 5–15)
BUN: 15 mg/dL (ref 6–20)
CO2: 26 mmol/L (ref 22–32)
Calcium: 8.7 mg/dL — ABNORMAL LOW (ref 8.9–10.3)
Chloride: 105 mmol/L (ref 98–111)
Creatinine, Ser: 1.1 mg/dL (ref 0.61–1.24)
GFR, Estimated: >60 mL/min (ref >60)
Glucose, Bld: 92 mg/dL (ref 70–99)
Potassium: 4.9 mmol/L (ref 3.5–5.1)
Sodium: 135 mmol/L (ref 135–145)

## 2020-11-12 LAB — CBC
HCT: 38.6 % — ABNORMAL LOW (ref 39.0–52.0)
Hemoglobin: 13.2 g/dL (ref 13.0–17.0)
MCH: 32.7 pg (ref 26.0–34.0)
MCHC: 34.2 g/dL (ref 30.0–36.0)
MCV: 95.5 fL (ref 80.0–100.0)
Platelets: 154 10*3/uL (ref 150–400)
RBC: 4.04 MIL/uL — ABNORMAL LOW (ref 4.22–5.81)
RDW: 12.4 % (ref 11.5–15.5)
WBC: 8.9 10*3/uL (ref 4.0–10.5)
nRBC: 0 % (ref 0.0–0.2)

## 2020-11-12 LAB — PSA (REFLEX TO FREE) (SERIAL): Prostate Specific Ag, Serum: 0.9 ng/mL (ref 0.0–4.0)

## 2020-11-12 MED ORDER — PANTOPRAZOLE SODIUM 40 MG PO TBEC
40.0000 mg | DELAYED_RELEASE_TABLET | Freq: Every day | ORAL | Status: DC
  Administered 2020-11-12 – 2020-11-15 (×4): 40 mg via ORAL
  Filled 2020-11-12 (×4): qty 1

## 2020-11-12 MED ORDER — ALUM & MAG HYDROXIDE-SIMETH 200-200-20 MG/5ML PO SUSP
15.0000 mL | ORAL | Status: DC | PRN
  Administered 2020-11-12: 15 mL via ORAL
  Filled 2020-11-12: qty 30

## 2020-11-12 MED ORDER — CALCIUM CARBONATE ANTACID 500 MG PO CHEW: 1.0000 | CHEWABLE_TABLET | Freq: Three times a day (TID) | ORAL | Status: DC | PRN

## 2020-11-12 NOTE — H&P (Signed)
Chief Complaint: Patient was seen in consultation today for image guided kyphoplasty Chief Complaint  Patient presents with   Back Pain   at the request of Dr. Ophelia Charter, [place] Commons.  Referring Physician(s): Dr. Ophelia Charter, J.   Supervising Physician: Julieanne Cotton  Patient Status: [facility] - In-pt  History of Present Illness: Ricardo Stout is a 60 y.o. male with past medical history of seizure, who is currently hospitalized due to severe back pain. Patient had recent diagnosis of seizure, initially evaluated in the ED on 09/11/2020 with chief complaint of " feeling off."  Patient was discharged home with outpatient referral to neurology and was seen by Dr. Marjory Lies on 10/07/2020 and scheduled for MRI of brain and EEG. Patient presented to ED again on 11/10/2020 for frank tonic-clonic seizure activity with unresponsiveness and urinary incontinence.  MRI brain was normal. After the seizure, patient developed acute onset of back pain, underwent CT lumbar spine which showed:  1. Acute compression fractures involving the T12 and L2 vertebral bodies, with additional possible subtle compression fracture involving the superior endplate of L1 as above. 2. Mild compression deformity involving the superior endplate of T11, age indeterminate, and could be chronic in nature. 3. Mild compression deformity involving the inferior endplate of L4, likely chronic. 4. Left foraminal disc protrusion at L3-4, contacting and potentially affecting the exiting left L3 nerve root. 5. Broad right subarticular to foraminal disc protrusion at L4-5, potentially affecting either the exiting right L4 or descending L5 nerve roots. 6. 8 mm soft tissue density at the level of the left neural foramen at L5-S1, likely a small extruded disc fragment. This closely approximates the exiting left L5 nerve root.   Patient was placed on TLSO brace and discharged home after evaluation in ED 11/10/2020 as he was able to ambulate and  transfer himself.  Unfortunately patient returned to ED on 11/11/2020 due to another episode of seizure.  Patient was unable to ambulate or transfer due to severe back pain, patient was admitted to the hospital for further evaluation and management of seizure and severe lower back pain.  IR was requested for image guided kyphoplasty. Case was reviewed and approved for T12 and L2 kyphoplasty by Dr. Corliss Skains.  Patient laying in bed, not in acute distress.  Family members at the bedside. Reports chronic cough in the morning due to mucus buildup.  Reports lower back pain 7 out of 10 pain scale, worse with movement and PT.  Denise headache, fever, chills, shortness of breath, chest pain, abdominal pain, nausea ,vomiting, and bleeding.   Past Medical History:  Diagnosis Date   Seizures [facility])     Past Surgical History:  Procedure Laterality Date   HAND SURGERY Right     Allergies: Patient has no known allergies.  Medications: Prior to Admission medications   Medication Sig Start Date End Date Taking? Authorizing Provider  ibuprofen (ADVIL) 200 MG tablet Take 200 mg by mouth every 6 (six) hours as needed (pain).   Yes [provider]  oxyCODONE-acetaminophen (PERCOCET) 5-325 MG tablet Take 1 tablet by mouth every 6 (six) hours as needed for up to 5 days for severe pain. 11/10/20 11/15/20 Yes Horton, Clabe Seal, DO  levETIRAcetam (KEPPRA) 500 MG tablet Take 1 tablet (500 mg total) by mouth 2 (two) times daily. 11/10/20   Gerhard Munch, MD  ondansetron (ZOFRAN ODT) 4 MG disintegrating tablet Take 1 tablet (4 mg total) by mouth every 8 (eight) hours as needed for nausea or vomiting. 11/10/20  Gerhard MunchLockwood, Robert, MD     Family History  Problem Relation Age of Onset   Thyroid disease Mother    Cancer Father    Heart attack Father    COPD Father    Breast cancer Sister    Hypertension Sister    Seizures Maternal Grandmother     Social History   Socioeconomic History   Marital  status: Married    Spouse name: [name]   Number of children: 1   Years of education: Not on file   Highest education level: Not on file  Occupational History   Occupation: IT  Tobacco Use   Smoking status: Never   Smokeless tobacco: Never  Vaping Use   Vaping Use: Never used  Substance and Sexual Activity   Alcohol use: Yes    Alcohol/week: 2.0 standard drinks    Types: 2 Cans of beer per week    Comment: or less   Drug use: Yes    Types: Marijuana    Comment: 10/07/20 daily   Sexual activity: Not on file  Other Topics Concern   Not on file  Social History Narrative   Lives with wife   'caffeine- maybe 1 soda a day   Social Determinants of Health   Financial Resource Strain: Not on file  Food Insecurity: Not on file  Transportation Needs: Not on file  Physical Activity: Not on file  Stress: Not on file  Social Connections: Not on file     Review of Systems: A 12 point ROS discussed and pertinent positives are indicated in the HPI above.  All other systems are negative.   Vital Signs: BP 111/76 (BP Location: Left Arm)   Pulse 71   Temp 98.7 F (37.1 C) (Oral)   Resp 18   Ht 5\' 10"  (1.778 m)   Wt 175 lb (79.4 kg)   SpO2 92%   BMI 25.11 kg/m   Physical Exam Vitals reviewed.  Constitutional:      General: He is not in acute distress.    Appearance: Normal appearance. He is not ill-appearing.  HENT:     Head: Normocephalic and atraumatic.     Mouth/Throat:     Mouth: Mucous membranes are moist.  Cardiovascular:     Rate and Rhythm: Normal rate and regular rhythm.     Pulses: Normal pulses.     Heart sounds: Normal heart sounds.  Pulmonary:     Effort: Pulmonary effort is normal. No respiratory distress.     Breath sounds: Normal breath sounds.  Abdominal:     General: Abdomen is flat. Bowel sounds are normal.     Palpations: Abdomen is soft.  Musculoskeletal:     Cervical back: Neck supple.  Skin:    General: Skin is warm and dry.  Neurological:      Mental Status: He is alert and oriented to person, place, and time.  Psychiatric:        Mood and Affect: Mood normal.        Behavior: Behavior normal.        Judgment: Judgment normal.    MD Evaluation Airway: WNL Heart: WNL Abdomen: WNL Chest/ Lungs: WNL ASA  Classification: 3 Mallampati/Airway Score: Two  Imaging: CT Head Wo Contrast  Result Date: 11/10/2020 CLINICAL DATA:  60 year old male with dizziness. EXAM: CT HEAD WITHOUT CONTRAST TECHNIQUE: Contiguous axial images were obtained from the base of the skull through the vertex without intravenous contrast. COMPARISON:  None. FINDINGS: Brain: The ventricles and sulci  appropriate size for patient's age. The gray-white matter discrimination is preserved. There is no acute intracranial hemorrhage. No mass effect or midline shift. No extra-axial fluid collection. Vascular: No hyperdense vessel or unexpected calcification. Skull: Normal. Negative for fracture or focal lesion. Sinuses/Orbits: There is mucoperiosteal thickening of paranasal sinuses with opacification of several ethmoid air cells. The mastoid air cells are clear. Air-fluid level. Other: None IMPRESSION: 1. Unremarkable noncontrast CT of the brain. 2. Paranasal sinus disease. Electronically Signed   By: Elgie Collard M.D.   On: 11/10/2020 03:21   CT Lumbar Spine Wo Contrast  Result Date: 11/10/2020 CLINICAL DATA:  Initial evaluation for low back pain, trauma. EXAM: CT LUMBAR SPINE WITHOUT CONTRAST TECHNIQUE: Multidetector CT imaging of the lumbar spine was performed without intravenous contrast administration. Multiplanar CT image reconstructions were also generated. COMPARISON:  None. FINDINGS: Segmentation: Standard. Lowest well-formed disc space labeled the L5-S1 level. Alignment: Physiologic with preservation of the normal lumbar lordosis. No listhesis. Vertebrae: Mild compression deformity involving the superior endplate of T11 with no more than mild 10% central height  loss but no bony retropulsion, age indeterminate. Acute burst type compression fracture involving the T12 vertebral body with up to 35% height loss and 4 mm bony retropulsion. Suspected additional subtle acute compression fracture involving the superior endplate of L1 with minimal height loss but no bony retropulsion. Acute burst type compression fracture involving the superior endplate of L2 with up to 25% height loss with trace 2 mm bony retropulsion. Compression deformity involving the inferior endplate of L4 with up to 35% height loss without bony retropulsion, chronic in appearance. No other acute fracture. Visualized sacrum and pelvis intact. No discrete osseous lesions. Paraspinal and other soft tissues: Mild paraspinous edema adjacent to the acute compression fractures. Paraspinous soft tissues demonstrate no other acute finding. Mild aorto bi-iliac atherosclerotic disease. Disc levels: T11-12: 4 mm bony retropulsion related to the acute T12 compression fracture. No significant spinal stenosis. Foramina remain patent. T12-L1: Unremarkable. L1-2: Trace 2 mm bony retropulsion related to the L2 compression fracture. No significant spinal stenosis. Foramina remain patent. L2-3:  Negative interspace.  Mild facet hypertrophy.  No stenosis. L3-4: Mild disc bulge. Superimposed left foraminal disc protrusion contacts the exiting left L3 nerve root (series 3, image 102). Mild facet hypertrophy. No spinal stenosis. Mild left foraminal narrowing. L4-5: Mild disc bulge. Superimposed broad base right subarticular to foraminal disc protrusion, potentially affecting either the exiting right L4 or descending L5 nerve roots (series 3, image 118). Mild facet hypertrophy. No significant spinal stenosis. Mild left with mild to moderate right L4 foraminal narrowing. L5-S1: Mild disc bulge, eccentric to the left. 8 mm soft tissue density at the level of the left neural foramen likely reflects a small extruded disc fragment (series  3, image 133). This closely approximates and could potentially affect the exiting left L5 nerve root. Mild facet hypertrophy. No significant spinal stenosis. Mild left foraminal narrowing. IMPRESSION: 1. Acute compression fractures involving the T12 and L2 vertebral bodies, with additional possible subtle compression fracture involving the superior endplate of L1 as above. 2. Mild compression deformity involving the superior endplate of T11, age indeterminate, and could be chronic in nature. 3. Mild compression deformity involving the inferior endplate of L4, likely chronic. 4. Left foraminal disc protrusion at L3-4, contacting and potentially affecting the exiting left L3 nerve root. 5. Broad right subarticular to foraminal disc protrusion at L4-5, potentially affecting either the exiting right L4 or descending L5 nerve roots. 6. 8 mm  soft tissue density at the level of the left neural foramen at L5-S1, likely a small extruded disc fragment. This closely approximates the exiting left L5 nerve root. Electronically Signed   By: Rise Mu M.D.   On: 11/10/2020 04:04   MR BRAIN W WO CONTRAST  Result Date: 11/10/2020 CLINICAL DATA:  Seizure-like activity. Chronic headache. Neuro deficit with stroke suspected. EXAM: MRI HEAD WITHOUT AND WITH CONTRAST TECHNIQUE: Multiplanar, multiecho pulse sequences of the brain and surrounding structures were obtained without and with intravenous contrast. CONTRAST:  7.41mL GADAVIST GADOBUTROL 1 MMOL/ML IV SOLN COMPARISON:  Head CT from earlier today FINDINGS: Brain: No acute infarction, hemorrhage, hydrocephalus, extra-axial collection or mass lesion. No cortical finding to explain seizure. No abnormal enhancement. Vascular: Normal flow voids. Skull and upper cervical spine: Normal marrow signal. Sinuses/Orbits: Mild patchy opacification of paranasal sinuses and left more than right mastoids. Other: Mild motion artifact IMPRESSION: Normal MRI of the brain. Electronically  Signed   By: Marnee Spring M.D.   On: 11/10/2020 05:39   DG Chest Portable 1 View  Result Date: 11/10/2020 CLINICAL DATA:  Recent seizure-like activity EXAM: PORTABLE CHEST 1 VIEW COMPARISON:  None. FINDINGS: Cardiac shadow is within normal limits. The lungs are well aerated bilaterally. Some patchy airspace opacity is noted in the right upper lobe which may represent some early infiltrate. No bony abnormality is seen. IMPRESSION: Patchy airspace opacity in the right upper lobe suggestive of early infiltrate. Electronically Signed   By: Alcide Clever M.D.   On: 11/10/2020 03:00   DG Shoulder Left  Result Date: 11/10/2020 CLINICAL DATA:  Recent seizure-like activity with shoulder pain, initial encounter EXAM: LEFT SHOULDER - 2+ VIEW COMPARISON:  None. FINDINGS: Degenerative changes of the acromioclavicular joint are seen. No acute fracture or dislocation is seen. The underlying bony thorax is within normal limits. No soft tissue abnormality is seen. IMPRESSION: Degenerative change without acute abnormality. Electronically Signed   By: Alcide Clever M.D.   On: 11/10/2020 02:59   DG Shoulder Left Portable  Result Date: 11/10/2020 CLINICAL DATA:  Left shoulder pain post suture. EXAM: LEFT SHOULDER COMPARISON:  None. FINDINGS: Minimal degenerative changes of the [facility] joint and glenohumeral joints. No acute fracture or dislocation. IMPRESSION: 1. No acute findings. 2. Mild degenerative changes. Electronically Signed   By: Elberta Fortis M.D.   On: 11/10/2020 16:38   DG Shoulder Right Portable  Result Date: 11/10/2020 CLINICAL DATA:  Right shoulder pain post seizure. EXAM: PORTABLE RIGHT SHOULDER COMPARISON:  None. FINDINGS: Minimal degenerative changes of the [facility] joint and glenohumeral joint. No acute fracture or dislocation. IMPRESSION: No acute findings. Electronically Signed   By: Elberta Fortis M.D.   On: 11/10/2020 16:37   EEG adult  Result Date: 11/12/2020 Charlsie Quest, MD     11/12/2020 12:45 PM  Patient Name: Ricardo Stout MRN: [id number] Epilepsy Attending: Charlsie Quest Referring Physician/Provider: Dr Pamella Pert Date: 11/12/2020 Duration: 23.51 mins Patient history: 60 year old male with history of epilepsy who presented with back pain.  EEG to evaluate for seizures. Level of alertness: Awake, drowsy AEDs during EEG study: LEV Technical aspects: This EEG study was done with scalp electrodes positioned according to the 10-20 International system of electrode placement. Electrical activity was acquired at a sampling rate of 500Hz  and reviewed with a high frequency filter of 70Hz  and a low frequency filter of 1Hz . EEG data were recorded continuously and digitally stored. Description: The posterior dominant rhythm consists of 9 Hz activity of  moderate voltage (25-35 uV) seen predominantly in posterior head regions, symmetric and reactive to eye opening and eye closing. Drowsiness was characterized by attenuation of the posterior background rhythm.   Hyperventilation and photic stimulation were not performed.   IMPRESSION: This study is within normal limits. No seizures or epileptiform discharges were seen throughout the recording. Priyanka Annabelle Harman    Labs:  CBC: Recent Labs    11/10/20 0254 11/10/20 1649 11/11/20 1350 11/12/20 0316  WBC 21.1* 19.4* 12.0* 8.9  HGB 15.4 15.3 13.8 13.2  HCT 44.8 44.6 41.1 38.6*  PLT 218 204 148* 154    COAGS: No results for input(s): INR, APTT in the last 8760 hours.  BMP: Recent Labs    11/10/20 0254 11/10/20 1649 11/11/20 1350 11/12/20 0316  NA 136 136 136 135  K 3.7 4.4 4.0 4.9  CL 106 107 105 105  CO2 21* 17* 25 26  GLUCOSE 128* 129* 103* 92  BUN 14 14 12 15   CALCIUM 8.9 8.8* 8.8* 8.7*  CREATININE 1.10 1.15 1.05 1.10  GFRNONAA >60 >60 >60 >60    LIVER FUNCTION TESTS: Recent Labs    09/11/20 1140 11/10/20 0254 11/10/20 1649  BILITOT 0.4 1.0 1.4*  AST 24 37 48*  ALT 22 32 34  ALKPHOS 105 75 70  PROT 6.9 6.2* 6.2*  ALBUMIN  4.8 3.8 3.7    TUMOR MARKERS: No results for input(s): AFPTM, CEA, CA199, CHROMGRNA in the last 8760 hours.  Assessment and Plan: 60 y.o. male with acute compression fracture on T12 and L2 seen on CT scan on 11/10/2020, currently admitted for further evaluation and management of seizure and severe lower back pain.  IR was requested for image guided kyphoplasty. Case was reviewed and approved for T12 and L2 kyphoplasty by Dr. 01/11/2021.  Insurance approval is pending, will schedule the procedure once the procedure is approved by insurance. Timing of n.p.o. and labs will be decide once the procedure is approved by insurance.  Procedure is tentatively schedule for late this week pending insurance approval and NIR schedule.  CBC on 11/12/2020: RBC down 4.04, otherwise within normal limit BMP on 11/12/2020: Mild hypocalcemia 8.7, decreased anion gap at 4 VSS, afebrile   Thank you for this interesting consult.  I greatly enjoyed meeting Cosmo Tetreault and look forward to participating in their care.  A copy of this report was sent to the requesting provider on this date.  Electronically Signed: [name], PA-C 11/12/2020, 1:21 PM   I spent a total of 40 Minutes    in face to face in clinical consultation, greater than 50% of which was counseling/coordinating care for T12 and L2 kyphoplasty

## 2020-11-12 NOTE — Procedures (Signed)
Patient Name: Ricardo Stout  MRN: [id number]  Epilepsy Attending: Charlsie Quest  Referring Physician/Provider: Dr Pamella Pert Date: 11/12/2020 Duration: 23.51 mins  Patient history: 60 year old male with history of epilepsy who presented with back pain.  EEG to evaluate for seizures.  Level of alertness: Awake, drowsy  AEDs during EEG study: LEV  Technical aspects: This EEG study was done with scalp electrodes positioned according to the 10-20 International system of electrode placement. Electrical activity was acquired at a sampling rate of 500Hz  and reviewed with a high frequency filter of 70Hz  and a low frequency filter of 1Hz . EEG data were recorded continuously and digitally stored.   Description: The posterior dominant rhythm consists of 9 Hz activity of moderate voltage (25-35 uV) seen predominantly in posterior head regions, symmetric and reactive to eye opening and eye closing. Drowsiness was characterized by attenuation of the posterior background rhythm.   Hyperventilation and photic stimulation were not performed.     IMPRESSION: This study is within normal limits. No seizures or epileptiform discharges were seen throughout the recording.  Jakirah Zaun 

## 2020-11-12 NOTE — Progress Notes (Addendum)
PROGRESS NOTE  Ricardo Stout OMV:[id number] DOB: 18-Oct-1960 DOA: 11/11/2020 PCP: Jettie Pagan, NP   LOS: 1 day   Brief Narrative / Interim history: 60 year old male presents to the hospital with back pain following 2 seizure episodes.  He is generally a healthy male that over the last several months has been experiencing intermittent episodes of "feeling of", feeling like he has an out of body experience, awake, alert but disconnected from the body and poorly responsive.  He saw neurology as an outpatient with plans to get an MRI and EEG and potentially start Keppra if symptoms are persistent.  Looks like he had a seizure episode on 7/10, came to the ER was placed on Keppra and sent home.  He had another seizure 7/11, came back to the ER and sent home.  Following his seizures he was found to have acute compression fracture of T12 and L2.  He was placed on a TLSO.  Patient returned to the hospital on 7/12 due to severe and excruciating back pain and inability to get out of bed.  Has not had further seizures.  Neurosurgery reviewed images and recommending TLSO brace.  Admitting MD consulted IR to evaluate for kyphoplasty  Subjective / 24h Interval events: Continues to complain of significant back pain with minimal activity.  Has not had any seizures overnight.  Denies any chest pain, denies any shortness of breath  Assessment & Plan: Principal Problem T12, L2 compression fractures-IR consulted on admission, awaiting input.  For now continue conservative management with TLSO, pain control -PT evaluation pending -PSA ordered on admission, pending -He is essentially bedbound right now and unable to get up and walk  Active Problems Seizure disorder -several episodes of apparent aura episodes, with 2 seizure episodes on 7/10 and 7/11.  He has since been started on Keppra, now seizure-free.  A brain MRI was obtained on his initial presentation on 7/10 which was normal -Obtain EEG today    Marijuana dependence -Cessation encouraged  Scheduled Meds:  docusate sodium  100 mg Oral BID   levETIRAcetam  500 mg Oral BID   lidocaine  1 patch Transdermal Q24H   Continuous Infusions:  methocarbamol (ROBAXIN) IV 500 mg (11/12/20 1112)   PRN Meds:.acetaminophen **OR** acetaminophen, HYDROcodone-acetaminophen, LORazepam, methocarbamol (ROBAXIN) IV, morphine injection, ondansetron **OR** ondansetron (ZOFRAN) IV, polyethylene glycol  Diet Orders (From admission, onward)     Start     Ordered   11/11/20 1704  Diet regular Room service appropriate? Yes; Fluid consistency: Thin  Diet effective now       Question Answer Comment  Room service appropriate? Yes   Fluid consistency: Thin      11/11/20 1705            DVT prophylaxis: SCDs Start: 11/11/20 1704     Code Status: Full Code  Family Communication: Discussed with wife over the phone  Status is: Inpatient  Remains inpatient appropriate because:IV treatments appropriate due to intensity of illness or inability to take PO and Inpatient level of care appropriate due to severity of illness  Dispo: The patient is from: Home              Anticipated d/c is to:  TBD              Patient currently is not medically stable to d/c.   Difficult to place patient No   Level of care: Med-Surg  Consultants:  Neurosurgery IR  Procedures:  None   Microbiology  None  Antimicrobials: None     Objective: Vitals:   11/11/20 1708 11/11/20 1810 11/11/20 2218 11/12/20 0737  BP: 110/64 127/80 117/77 111/76  Pulse: 71 74 67 71  Resp: 18 17 16 18   Temp: 98.5 F (36.9 C) 98.6 F (37 C) 99 F (37.2 C) 98.7 F (37.1 C)  TempSrc: Oral Oral Oral Oral  SpO2: 93% 92% 91% 92%  Weight:      Height:        Intake/Output Summary (Last 24 hours) at 11/12/2020 1121 Last data filed at 11/12/2020 0650 Gross per 24 hour  Intake 540 ml  Output 400 ml  Net 140 ml   Filed Weights   11/11/20 0928  Weight: 79.4 kg     Examination:  Constitutional: NAD Eyes: no scleral icterus ENMT: Mucous membranes are moist.  Neck: normal, supple Respiratory: clear to auscultation bilaterally, no wheezing, no crackles. Normal respiratory effort.  Cardiovascular: Regular rate and rhythm, no murmurs / rubs / gallops. No LE edema.  Abdomen: non distended, no tenderness.  Musculoskeletal: no clubbing / cyanosis.  Skin: no rashes Neurologic: CN 2-12 grossly intact. Strength 5/5 in all 4.  Psychiatric: Normal judgment and insight. Alert and oriented x 3. Normal mood.    Data Reviewed: I have independently reviewed following labs and imaging studies  CBC: Recent Labs  Lab 11/10/20 0254 11/10/20 1649 11/11/20 1350 11/12/20 0316  WBC 21.1* 19.4* 12.0* 8.9  NEUTROABS 18.7* 17.5* 10.0*  --   HGB 15.4 15.3 13.8 13.2  HCT 44.8 44.6 41.1 38.6*  MCV 95.1 95.9 96.9 95.5  PLT 218 204 148* 154   Basic Metabolic Panel: Recent Labs  Lab 11/10/20 0254 11/10/20 1649 11/11/20 1350 11/12/20 0316  NA 136 136 136 135  K 3.7 4.4 4.0 4.9  CL 106 107 105 105  CO2 21* 17* 25 26  GLUCOSE 128* 129* 103* 92  BUN 14 14 12 15   CREATININE 1.10 1.15 1.05 1.10  CALCIUM 8.9 8.8* 8.8* 8.7*  MG 2.0  --   --   --    Liver Function Tests: Recent Labs  Lab 11/10/20 0254 11/10/20 1649  AST 37 48*  ALT 32 34  ALKPHOS 75 70  BILITOT 1.0 1.4*  PROT 6.2* 6.2*  ALBUMIN 3.8 3.7   Coagulation Profile: No results for input(s): INR, PROTIME in the last 168 hours. HbA1C: No results for input(s): HGBA1C in the last 72 hours. CBG: Recent Labs  Lab 11/10/20 1538  GLUCAP 160*    Recent Results (from the past 240 hour(s))  Resp Panel by RT-PCR (Flu A&B, Covid) Nasopharyngeal Swab     Status: None   Collection Time: 11/11/20  1:50 PM   Specimen: Nasopharyngeal Swab; Nasopharyngeal(NP) swabs in vial transport medium  Result Value Ref Range Status   SARS Coronavirus 2 by RT PCR NEGATIVE NEGATIVE Final    Comment:  (NOTE) SARS-CoV-2 target nucleic acids are NOT DETECTED.  The SARS-CoV-2 RNA is generally detectable in upper respiratory specimens during the acute phase of infection. The lowest concentration of SARS-CoV-2 viral copies this assay can detect is 138 copies/mL. A negative result does not preclude SARS-Cov-2 infection and should not be used as the sole basis for treatment or other patient management decisions. A negative result may occur with  improper specimen collection/handling, submission of specimen other than nasopharyngeal swab, presence of viral mutation(s) within the areas targeted by this assay, and inadequate number of viral copies(<138 copies/mL). A negative result must be combined with clinical observations,  patient history, and epidemiological information. The expected result is Negative.  Fact Sheet for Patients:  BloggerCourse.com  Fact Sheet for Healthcare Providers:  SeriousBroker.it  This test is no t yet approved or cleared by the [place] FDA and  has been authorized for detection and/or diagnosis of SARS-CoV-2 by FDA under an Emergency Use Authorization (EUA). This EUA will remain  in effect (meaning this test can be used) for the duration of the COVID-19 declaration under Section 564(b)(1) of the Act, 21 U.S.C.section 360bbb-3(b)(1), unless the authorization is terminated  or revoked sooner.       Influenza A by PCR NEGATIVE NEGATIVE Final   Influenza B by PCR NEGATIVE NEGATIVE Final    Comment: (NOTE) The Xpert Xpress SARS-CoV-2/FLU/RSV plus assay is intended as an aid in the diagnosis of influenza from Nasopharyngeal swab specimens and should not be used as a sole basis for treatment. Nasal washings and aspirates are unacceptable for Xpert Xpress SARS-CoV-2/FLU/RSV testing.  Fact Sheet for Patients: BloggerCourse.com  Fact Sheet for Healthcare  Providers: SeriousBroker.it  This test is not yet approved or cleared by the [place] FDA and has been authorized for detection and/or diagnosis of SARS-CoV-2 by FDA under an Emergency Use Authorization (EUA). This EUA will remain in effect (meaning this test can be used) for the duration of the COVID-19 declaration under Section 564(b)(1) of the Act, 21 U.S.C. section 360bbb-3(b)(1), unless the authorization is terminated or revoked.  Performed at [facility], [address] [place], [place] [address]      Radiology Studies: No results found.   Pamella Pert, MD, PhD [facility]  Between 7 am - 7 pm I am available, please contact me via Amion (for emergencies) or Securechat (non urgent messages)  Between 7 pm - 7 am I am not available, please contact night coverage MD/APP via Amion

## 2020-11-12 NOTE — Progress Notes (Signed)
PT Cancellation Note  Patient Details Name: Robel Wuertz MRN: [id number] DOB: 08-26-1960   Cancelled Treatment:    Reason Eval/Treat Not Completed: Pain limiting ability to participate.  Will get meds and return at another time.   Ivar Drape 11/12/2020, 9:37 AM  Samul Dada, PT MS Acute Rehab Dept. Number: [facility] [id number] and [facility] [phone]

## 2020-11-12 NOTE — Progress Notes (Signed)
EEG complete - results pending 

## 2020-11-12 NOTE — Progress Notes (Signed)
PT Cancellation Note  Patient Details Name: Matthan Sledge MRN: [id number] DOB: 01/04/1961   Cancelled Treatment:    Reason Eval/Treat Not Completed: Other (comment).  Pt was getting EEG earlier and now is sleeping.  Follow up as time and pt allow.   Ivar Drape 11/12/2020, 2:49 PM  Samul Dada, PT MS Acute Rehab Dept. Number: [facility] [id number] and [facility] [phone]

## 2020-11-13 LAB — PROTIME-INR
INR: 1.1 (ref 0.8–1.2)
Prothrombin Time: 13.9 seconds (ref 11.4–15.2)

## 2020-11-13 MED ORDER — HYDROCORTISONE 1 % EX CREA
1.0000 "application " | TOPICAL_CREAM | CUTANEOUS | Status: DC | PRN
  Administered 2020-11-13: 1 via TOPICAL
  Filled 2020-11-13 (×2): qty 28

## 2020-11-13 NOTE — Evaluation (Signed)
Physical Therapy Evaluation Patient Details Name: Ricardo Stout MRN: [id number] DOB: [birth year] Today's Date: 11/13/2020   History of Present Illness  Pt is a 60yo males who came to ED with severe back pain s/p seizure and inability to amb. CT revealed  T12 and L2 compression fractures in addition to L foraminal dix protrusion at L3-4. Currently waiting on insurance approval for kyphoplasty. PMH: Pt with recent dx of seizures in 09/2020. Most recent sz 11/10/2020.   Clinical Impression  Pt admitted with above. Pt remains in 9/10 pain but able to sit up to EOB and stand with minA of therapist however labored effort for patient requiring increased time as pt was also screaming in pain. Pt only able to amb 3' with RW and complete dependence on bilat UEs. Pt unable to complete stair negotiation necessary to enter home or access bed/bath at this time. Aware pt is awaiting insurance approval for kyphoplasty. Spoke with patient and his mother regarding a shirt/tank must be worn under the TLSO to minimize skin breakdown. PT to continue to follow to progress independence.    Follow Up Recommendations Home health PT;Supervision/Assistance - 24 hour    Equipment Recommendations  Rolling walker with 5" wheels;3in1 (PT)    Recommendations for Other Services       Precautions / Restrictions Precautions Precautions: Fall Precaution Comments: seizures Required Braces or Orthoses: Spinal Brace Spinal Brace: Thoracolumbosacral orthotic;Applied in sitting position Restrictions Weight Bearing Restrictions: No      Mobility  Bed Mobility Overal bed mobility: Needs Assistance Bed Mobility: Rolling;Sidelying to Sit;Sit to Sidelying Rolling: Min guard Sidelying to sit: Min assist     Sit to sidelying: Min assist General bed mobility comments: pt with definite use of bed rails, increased time, screaming in pain, minA for trunk elevation and lowering into sidelying to off weight body weight to help  manage pain    Transfers Overall transfer level: Needs assistance Equipment used: Rolling walker (2 wheeled) Transfers: Sit to/from Stand Sit to Stand: Min assist         General transfer comment: minA to power up and steady during transition of hands, verbal cues to depend more on bilat LEs, v/c's for hand placement, increased time, minA to lower self down to bed when returning to sit  Ambulation/Gait Ambulation/Gait assistance: Min assist Gait Distance (Feet): 3 Feet Assistive device: Rolling walker (2 wheeled) Gait Pattern/deviations: Step-to pattern;Decreased stride length Gait velocity: slow Gait velocity interpretation: <1.8 ft/sec, indicate of risk for recurrent falls General Gait Details: pt suspending self with UEs with minimal bilat LE WBing, encouraged increased weight t/o LEs, pt with minimal tolerance due to increased pain with bilat LE WBing  Stairs            Wheelchair Mobility    Modified Rankin (Stroke Patients Only)       Balance Overall balance assessment: Needs assistance Sitting-balance support: Feet supported;Bilateral upper extremity supported Sitting balance-Leahy Scale: Poor Sitting balance - Comments: pt initially suspending self with bilat UEs in full extension, progressed to relaxed UEs with minimal dependence on UEs   Standing balance support: Bilateral upper extremity supported Standing balance-Leahy Scale: Zero Standing balance comment: dependent on UEs                             Pertinent Vitals/Pain Pain Assessment: 0-10 Pain Score: 9  Pain Location: back Pain Descriptors / Indicators: Constant;Contraction;Spasm;Squeezing Pain Intervention(s): Monitored during session;Premedicated before session;Limited activity within  patient's tolerance    Home Living Family/patient expects to be discharged to:: Private residence Living Arrangements: Spouse/significant other Available Help at Discharge: Family;Available 24  hours/day Type of Home: House       Home Layout: Two level;Able to live on main level with bedroom/bathroom Home Equipment: None      Prior Function Level of Independence: Independent         Comments: pt works from home     Hand Dominance   Dominant Hand: Right    Extremity/Trunk Assessment   Upper Extremity Assessment Upper Extremity Assessment: Overall WFL for tasks assessed    Lower Extremity Assessment Lower Extremity Assessment: Overall WFL for tasks assessed    Cervical / Trunk Assessment Cervical / Trunk Assessment: Other exceptions (recent fractures in low back)  Communication   Communication: No difficulties  Cognition Arousal/Alertness: Awake/alert Behavior During Therapy: WFL for tasks assessed/performed Overall Cognitive Status: Within Functional Limits for tasks assessed                                        General Comments General comments (skin integrity, edema, etc.): mother reports "I think he has a rash on his back"    Exercises     Assessment/Plan    PT Assessment Patient needs continued PT services  PT Problem List Decreased strength;Decreased activity tolerance;Decreased balance;Decreased mobility;Decreased knowledge of use of DME;Pain       PT Treatment Interventions DME instruction;Gait training;Stair training;Functional mobility training;Therapeutic activities;Therapeutic exercise;Balance training;Neuromuscular re-education    PT Goals (Current goals can be found in the Care Plan section)  Acute Rehab PT Goals Patient Stated Goal: stop the pain and go home ASAP PT Goal Formulation: With patient Time For Goal Achievement: 11/27/20 Potential to Achieve Goals: Good    Frequency Min 4X/week   Barriers to discharge        Co-evaluation               AM-PAC PT "6 Clicks" Mobility  Outcome Measure Help needed turning from your back to your side while in a flat bed without using bedrails?: A  Little Help needed moving from lying on your back to sitting on the side of a flat bed without using bedrails?: A Little Help needed moving to and from a bed to a chair (including a wheelchair)?: A Little Help needed standing up from a chair using your arms (e.g., wheelchair or bedside chair)?: A Little Help needed to walk in hospital room?: A Little Help needed climbing 3-5 steps with a railing? : A Lot 6 Click Score: 17    End of Session Equipment Utilized During Treatment: Gait belt Activity Tolerance: Patient limited by pain Patient left: in bed;with call bell/phone within reach;with family/visitor present (pt placed in chair position in bed, as pt with poor sitting tolerance and unable to tolerate recliner) Nurse Communication: Mobility status PT Visit Diagnosis: Unsteadiness on feet (R26.81);Difficulty in walking, not elsewhere classified (R26.2);Pain Pain - part of body:  (back)    Time: 9518-8416 PT Time Calculation (min) (ACUTE ONLY): 28 min   Charges:   PT Evaluation $PT Eval Moderate Complexity: 1 Mod PT Treatments $Therapeutic Activity: 8-22 mins        Lewis Shock, PT, DPT Acute Rehabilitation Services Pager #: [phone] Office #: [phone]   [name] 11/13/2020, 10:16 AM

## 2020-11-13 NOTE — Progress Notes (Signed)
PROGRESS NOTE    Ricardo Stout  EQA:[id number] DOB: April 02, 1961 DOA: 11/11/2020 PCP: Jettie Pagan, NP    Brief Narrative:  60 year old male presents to the hospital with back pain following 2 seizure episodes.  He is generally a healthy male that over the last several months has been experiencing intermittent episodes of "feeling of", feeling like he has an out of body experience, awake, alert but disconnected from the body and poorly responsive.  He saw neurology as an outpatient with plans to get an MRI and EEG and potentially start Keppra if symptoms are persistent.  Looks like he had a seizure episode on 7/10, came to the ER was placed on Keppra and sent home.  He had another seizure 7/11, came back to the ER and sent home.  Following his seizures he was found to have acute compression fracture of T12 and L2.  He was placed on a TLSO.  Patient returned to the hospital on 7/12 due to severe and excruciating back pain and inability to get out of bed.  Has not had further seizures.  Neurosurgery reviewed images and recommending TLSO brace.  Admitting MD consulted IR to evaluate for kyphoplasty  7/13 EEG negative. With back pain. No other complaints  Consultants:  Neurosurgery, IR,   Procedures:   Antimicrobials:      Subjective: No sob, cp. No seizures reported overnight  Objective: Vitals:   11/12/20 0737 11/12/20 1500 11/12/20 1933 11/13/20 0809  BP: 111/76 127/88 107/68 (!) 145/80  Pulse: 71 71 70 66  Resp: 18 17 16 17   Temp: 98.7 F (37.1 C) 98.6 F (37 C) 98.4 F (36.9 C) 98.1 F (36.7 C)  TempSrc: Oral Oral Oral Oral  SpO2: 92% 92% 91% 96%  Weight:      Height:        Intake/Output Summary (Last 24 hours) at 11/13/2020 0836 Last data filed at 11/13/2020 0755 Gross per 24 hour  Intake --  Output 300 ml  Net -300 ml   Filed Weights   11/11/20 0928  Weight: 79.4 kg    Examination:  General exam: Appears calm and comfortable  Respiratory system: Clear  to auscultation. Respiratory effort normal. Cardiovascular system: S1 & S2 heard, RRR. No gallop Gastrointestinal system: Abdomen is nondistended, soft and nontender.  Normal bowel sounds heard. Brace on Central nervous system: Alert and oriented.  Grossly intact Extremities: No edema Skin: Warm dry Psychiatry: Mood & affect appropriate.     Data Reviewed: I have personally reviewed following labs and imaging studies  CBC: Recent Labs  Lab 11/10/20 0254 11/10/20 1649 11/11/20 1350 11/12/20 0316  WBC 21.1* 19.4* 12.0* 8.9  NEUTROABS 18.7* 17.5* 10.0*  --   HGB 15.4 15.3 13.8 13.2  HCT 44.8 44.6 41.1 38.6*  MCV 95.1 95.9 96.9 95.5  PLT 218 204 148* 154   Basic Metabolic Panel: Recent Labs  Lab 11/10/20 0254 11/10/20 1649 11/11/20 1350 11/12/20 0316  NA 136 136 136 135  K 3.7 4.4 4.0 4.9  CL 106 107 105 105  CO2 21* 17* 25 26  GLUCOSE 128* 129* 103* 92  BUN 14 14 12 15   CREATININE 1.10 1.15 1.05 1.10  CALCIUM 8.9 8.8* 8.8* 8.7*  MG 2.0  --   --   --    GFR: Estimated Creatinine Clearance: 75.6 mL/min (by C-G formula based on SCr of 1.1 mg/dL). Liver Function Tests: Recent Labs  Lab 11/10/20 0254 11/10/20 1649  AST 37 48*  ALT 32 34  ALKPHOS 75 70  BILITOT 1.0 1.4*  PROT 6.2* 6.2*  ALBUMIN 3.8 3.7   No results for input(s): LIPASE, AMYLASE in the last 168 hours. No results for input(s): AMMONIA in the last 168 hours. Coagulation Profile: No results for input(s): INR, PROTIME in the last 168 hours. Cardiac Enzymes: No results for input(s): CKTOTAL, CKMB, CKMBINDEX, TROPONINI in the last 168 hours. BNP (last 3 results) No results for input(s): PROBNP in the last 8760 hours. HbA1C: No results for input(s): HGBA1C in the last 72 hours. CBG: Recent Labs  Lab 11/10/20 1538  GLUCAP 160*   Lipid Profile: No results for input(s): CHOL, HDL, LDLCALC, TRIG, CHOLHDL, LDLDIRECT in the last 72 hours. Thyroid Function Tests: No results for input(s): TSH,  T4TOTAL, FREET4, T3FREE, THYROIDAB in the last 72 hours. Anemia Panel: No results for input(s): VITAMINB12, FOLATE, FERRITIN, TIBC, IRON, RETICCTPCT in the last 72 hours. Sepsis Labs: No results for input(s): PROCALCITON, LATICACIDVEN in the last 168 hours.  Recent Results (from the past 240 hour(s))  Resp Panel by RT-PCR (Flu A&B, Covid) Nasopharyngeal Swab     Status: None   Collection Time: 11/11/20  1:50 PM   Specimen: Nasopharyngeal Swab; Nasopharyngeal(NP) swabs in vial transport medium  Result Value Ref Range Status   SARS Coronavirus 2 by RT PCR NEGATIVE NEGATIVE Final    Comment: (NOTE) SARS-CoV-2 target nucleic acids are NOT DETECTED.  The SARS-CoV-2 RNA is generally detectable in upper respiratory specimens during the acute phase of infection. The lowest concentration of SARS-CoV-2 viral copies this assay can detect is 138 copies/mL. A negative result does not preclude SARS-Cov-2 infection and should not be used as the sole basis for treatment or other patient management decisions. A negative result may occur with  improper specimen collection/handling, submission of specimen other than nasopharyngeal swab, presence of viral mutation(s) within the areas targeted by this assay, and inadequate number of viral copies(<138 copies/mL). A negative result must be combined with clinical observations, patient history, and epidemiological information. The expected result is Negative.  Fact Sheet for Patients:  BloggerCourse.com  Fact Sheet for Healthcare Providers:  SeriousBroker.it  This test is no t yet approved or cleared by the [place] FDA and  has been authorized for detection and/or diagnosis of SARS-CoV-2 by FDA under an Emergency Use Authorization (EUA). This EUA will remain  in effect (meaning this test can be used) for the duration of the COVID-19 declaration under Section 564(b)(1) of the Act, 21 U.S.C.section  360bbb-3(b)(1), unless the authorization is terminated  or revoked sooner.       Influenza A by PCR NEGATIVE NEGATIVE Final   Influenza B by PCR NEGATIVE NEGATIVE Final    Comment: (NOTE) The Xpert Xpress SARS-CoV-2/FLU/RSV plus assay is intended as an aid in the diagnosis of influenza from Nasopharyngeal swab specimens and should not be used as a sole basis for treatment. Nasal washings and aspirates are unacceptable for Xpert Xpress SARS-CoV-2/FLU/RSV testing.  Fact Sheet for Patients: BloggerCourse.com  Fact Sheet for Healthcare Providers: SeriousBroker.it  This test is not yet approved or cleared by the [place] FDA and has been authorized for detection and/or diagnosis of SARS-CoV-2 by FDA under an Emergency Use Authorization (EUA). This EUA will remain in effect (meaning this test can be used) for the duration of the COVID-19 declaration under Section 564(b)(1) of the Act, 21 U.S.C. section 360bbb-3(b)(1), unless the authorization is terminated or revoked.  Performed at [facility], [address], [place], [place]  [address]          Radiology Studies: EEG adult  Result Date: 11-13-20 Charlsie Quest, MD     11/13/2020 12:45 PM Patient Name: Ricardo Stout MRN: [id number] Epilepsy Attending: Charlsie Quest Referring Physician/Provider: Dr Pamella Pert Date: 2020-11-13 Duration: 23.51 mins Patient history: 60 year old male with history of epilepsy who presented with back pain.  EEG to evaluate for seizures. Level of alertness: Awake, drowsy AEDs during EEG study: LEV Technical aspects: This EEG study was done with scalp electrodes positioned according to the 10-20 International system of electrode placement. Electrical activity was acquired at a sampling rate of 500Hz  and reviewed with a high frequency filter of 70Hz  and a low frequency filter of 1Hz . EEG data were recorded continuously and digitally  stored. Description: The posterior dominant rhythm consists of 9 Hz activity of moderate voltage (25-35 uV) seen predominantly in posterior head regions, symmetric and reactive to eye opening and eye closing. Drowsiness was characterized by attenuation of the posterior background rhythm.   Hyperventilation and photic stimulation were not performed.   IMPRESSION: This study is within normal limits. No seizures or epileptiform discharges were seen throughout the recording. Priyanka        Scheduled Meds:  docusate sodium  100 mg Oral BID   levETIRAcetam  500 mg Oral BID   lidocaine  1 patch Transdermal Q24H   pantoprazole  40 mg Oral Daily   Continuous Infusions:  methocarbamol (ROBAXIN) IV 500 mg (11/13/20 0044)    Assessment & Plan:   Principal Problem:   Compression fracture of lumbar vertebra (HCC) Active Problems:   Seizure disorder (HCC)   Marijuana dependence (HCC)   T12, L2 compression fractures-IR consulted on admission, awaiting input.  For now continue conservative management with TLSO, pain control -PT evaluation pending 7/13 PSA normal Plan for IR kyphoplasty. Currently bedbound and unable to get up and walk Brace in place Pain management   Active Problems Seizure disorder -several episodes of apparent aura episodes, with 2 seizure episodes on 7/10 and 7/11.  He has since been started on Keppra, now seizure-free.  A brain MRI was obtained on his initial presentation on 7/10 which was normal 7/13 EEG normal. Continue Keppra Will need to follow-up with outpatient neurology  Marijuana dependence -cessation was encouraged during this hospitalization   DVT prophylaxis: SCD Code Status: Full Family Communication: Family at bedside/mother Disposition Plan:  Status is: Inpatient  Remains inpatient appropriate because:Inpatient level of care appropriate due to severity of illness  Dispo: The patient is from: Home              Anticipated d/c is to: Home               Patient currently is not medically stable to d/c.   Difficult to place patient No            LOS: 2 days   Time spent: 35 minutes with more than 50% on COC    [id number], MD [facility] Pager [phone]  If 7PM-7AM, please contact night-coverage 11/13/2020, 8:36 AM

## 2020-11-13 NOTE — Progress Notes (Signed)
[facility] notifed that patient stated that the heat packs gave relief asked if patient could have k-pad order. Ilean Skill LPN

## 2020-11-13 NOTE — Progress Notes (Signed)
IR was requested for kyphoplasty.   The procedure was approved and tentatively scheduled for today, however, NIR was not able to accommodate the procedure today due to scheduled outpatient procedures/urgent cases.   RN and patient and his wife notified.  Mrs. [name] asked if there is any possibility that the procedure can occur tomorrow, informed Mrs. Tischer the NIR schedule tomorrow does not look promising but will do our best to accommodate him tomorrow.  Informed Mrs. Essick that will have to make Mr. Ricardo Stout npo again at midnight.  She verbalized understanding.   Made npo at midnight.  CBC for tomorrow am.   The procedure is tentatively scheduled for tomorrow pending NIR schedule.  Please call NIR for questions and concerns.    Lynann Bologna Tiare Rohlman PA-C 11/13/2020 3:10 PM

## 2020-11-14 ENCOUNTER — Inpatient Hospital Stay (HOSPITAL_COMMUNITY): Payer: BC Managed Care – PPO

## 2020-11-14 DIAGNOSIS — S32000S Wedge compression fracture of unspecified lumbar vertebra, sequela: Secondary | ICD-10-CM

## 2020-11-14 HISTORY — PX: IR KYPHO LUMBAR INC FX REDUCE BONE BX UNI/BIL CANNULATION INC/IMAGING: IMG5519

## 2020-11-14 HISTORY — PX: IR KYPHO EA ADDL LEVEL THORACIC OR LUMBAR: IMG5520

## 2020-11-14 LAB — CBC WITH DIFFERENTIAL/PLATELET
Abs Immature Granulocytes: 0.02 10*3/uL (ref 0.00–0.07)
Basophils Absolute: 0.1 10*3/uL (ref 0.0–0.1)
Basophils Relative: 1 %
Eosinophils Absolute: 0.3 10*3/uL (ref 0.0–0.5)
Eosinophils Relative: 6 %
HCT: 40.5 % (ref 39.0–52.0)
Hemoglobin: 14.2 g/dL (ref 13.0–17.0)
Immature Granulocytes: 0 %
Lymphocytes Relative: 25 %
Lymphs Abs: 1.5 10*3/uL (ref 0.7–4.0)
MCH: 32.3 pg (ref 26.0–34.0)
MCHC: 35.1 g/dL (ref 30.0–36.0)
MCV: 92 fL (ref 80.0–100.0)
Monocytes Absolute: 0.8 10*3/uL (ref 0.1–1.0)
Monocytes Relative: 13 %
Neutro Abs: 3.3 10*3/uL (ref 1.7–7.7)
Neutrophils Relative %: 55 %
Platelets: 158 10*3/uL (ref 150–400)
RBC: 4.4 MIL/uL (ref 4.22–5.81)
RDW: 11.9 % (ref 11.5–15.5)
WBC: 6 10*3/uL (ref 4.0–10.5)
nRBC: 0 % (ref 0.0–0.2)

## 2020-11-14 LAB — POTASSIUM: Potassium: 3.8 mmol/L (ref 3.5–5.1)

## 2020-11-14 MED ORDER — TOBRAMYCIN SULFATE 1.2 G IJ SOLR
INTRAMUSCULAR | Status: AC
  Filled 2020-11-14: qty 1.2

## 2020-11-14 MED ORDER — MIDAZOLAM HCL 2 MG/2ML IJ SOLN
INTRAMUSCULAR | Status: AC | PRN
  Administered 2020-11-14 (×2): 1 mg via INTRAVENOUS

## 2020-11-14 MED ORDER — FENTANYL CITRATE (PF) 100 MCG/2ML IJ SOLN
INTRAMUSCULAR | Status: AC | PRN
  Administered 2020-11-14 (×4): 25 ug via INTRAVENOUS

## 2020-11-14 MED ORDER — FENTANYL CITRATE (PF) 100 MCG/2ML IJ SOLN
INTRAMUSCULAR | Status: AC
  Filled 2020-11-14: qty 2

## 2020-11-14 MED ORDER — IOHEXOL 300 MG/ML  SOLN
50.0000 mL | Freq: Once | INTRAMUSCULAR | Status: AC | PRN
  Administered 2020-11-14: 15 mL

## 2020-11-14 MED ORDER — MIDAZOLAM HCL 2 MG/2ML IJ SOLN
INTRAMUSCULAR | Status: AC
  Filled 2020-11-14: qty 2

## 2020-11-14 MED ORDER — HYDROMORPHONE HCL 1 MG/ML IJ SOLN
INTRAMUSCULAR | Status: AC | PRN
  Administered 2020-11-14: 1 mg via INTRAVENOUS

## 2020-11-14 MED ORDER — BUPIVACAINE HCL (PF) 0.5 % IJ SOLN
INTRAMUSCULAR | Status: AC | PRN
  Administered 2020-11-14 (×2): 20 mL

## 2020-11-14 MED ORDER — CEFAZOLIN SODIUM-DEXTROSE 2-4 GM/100ML-% IV SOLN
INTRAVENOUS | Status: AC
  Administered 2020-11-14: 2 g via INTRAVENOUS
  Filled 2020-11-14: qty 100

## 2020-11-14 MED ORDER — BUPIVACAINE HCL (PF) 0.5 % IJ SOLN
INTRAMUSCULAR | Status: AC
  Filled 2020-11-14: qty 30

## 2020-11-14 MED ORDER — CEFAZOLIN SODIUM-DEXTROSE 2-4 GM/100ML-% IV SOLN: 2.0000 g | INTRAVENOUS | Status: AC

## 2020-11-14 MED ORDER — HYDROMORPHONE HCL 1 MG/ML IJ SOLN
INTRAMUSCULAR | Status: AC
  Filled 2020-11-14: qty 1

## 2020-11-14 MED ORDER — SODIUM CHLORIDE 0.9 % IV SOLN: INTRAVENOUS | Status: DC

## 2020-11-14 NOTE — Plan of Care (Signed)
  Problem: Education: Goal: Knowledge of General Education information will improve Description: Including pain rating scale, medication(s)/side effects and non-pharmacologic comfort measures Outcome: Progressing   Problem: Health Behavior/Discharge Planning: Goal: Ability to manage health-related needs will improve Outcome: Progressing   Problem: Clinical Measurements: Goal: Ability to maintain clinical measurements within normal limits will improve Outcome: Progressing   Problem: Nutrition: Goal: Adequate nutrition will be maintained Outcome: Progressing   Problem: Coping: Goal: Level of anxiety will decrease Outcome: Progressing   Problem: Safety: Goal: Ability to remain free from injury will improve Outcome: Progressing   Problem: Pain Managment: Goal: General experience of comfort will improve Outcome: Progressing   Problem: Skin Integrity: Goal: Risk for impaired skin integrity will decrease Outcome: Progressing

## 2020-11-14 NOTE — Progress Notes (Signed)
PROGRESS NOTE    Ricardo Stout  AYT:[id number] DOB: [birth year] DOA: 11/11/2020 PCP: Jettie Pagan, NP    Brief Narrative:  60 year old male presents to the hospital with back pain following 2 seizure episodes.  He is generally a healthy male that over the last several months has been experiencing intermittent episodes of "feeling of", feeling like he has an out of body experience, awake, alert but disconnected from the body and poorly responsive.  He saw neurology as an outpatient with plans to get an MRI and EEG and potentially start Keppra if symptoms are persistent.  Looks like he had a seizure episode on 7/10, came to the ER was placed on Keppra and sent home.  He had another seizure 7/11, came back to the ER and sent home.  Following his seizures he was found to have acute compression fracture of T12 and L2.  He was placed on a TLSO.  Patient returned to the hospital on 7/12 due to severe and excruciating back pain and inability to get out of bed.  Has not had further seizures.  Neurosurgery reviewed images and recommending TLSO brace.  Admitting MD consulted IR to evaluate for kyphoplasty  7/13 EEG negative.  7/14- pt is s/p kyphoplasty today by IR.   Consultants:  Neurosurgery, IR,   Procedures:   Antimicrobials:      Subjective: Currently not in too much pain . No n/v/sob. No seizure activity  Objective: Vitals:   11/14/20 0925 11/14/20 0930 11/14/20 1051 11/14/20 1502  BP: (!) 156/92 (!) 163/93 137/84 (!) 132/95  Pulse: 60 62 (!) 56 61  Resp: 16 19 18 16   Temp:    98.7 F (37.1 C)  TempSrc:    Oral  SpO2: 96% 94% 98% 96%  Weight:      Height:        Intake/Output Summary (Last 24 hours) at 11/14/2020 1704 Last data filed at 11/14/2020 0902 Gross per 24 hour  Intake 100 ml  Output 300 ml  Net -200 ml   Filed Weights   11/11/20 0928  Weight: 79.4 kg    Examination: Nad, calm Cta no w/r Regular s1/s2 no gallop Soft benign +bs No  edema aaxoxox4   Data Reviewed: I have personally reviewed following labs and imaging studies  CBC: Recent Labs  Lab 11/10/20 0254 11/10/20 1649 11/11/20 1350 11/12/20 0316 11/14/20 0213  WBC 21.1* 19.4* 12.0* 8.9 6.0  NEUTROABS 18.7* 17.5* 10.0*  --  3.3  HGB 15.4 15.3 13.8 13.2 14.2  HCT 44.8 44.6 41.1 38.6* 40.5  MCV 95.1 95.9 96.9 95.5 92.0  PLT 218 204 148* 154 158   Basic Metabolic Panel: Recent Labs  Lab 11/10/20 0254 11/10/20 1649 11/11/20 1350 11/12/20 0316 11/14/20 0213  NA 136 136 136 135  --   K 3.7 4.4 4.0 4.9 3.8  CL 106 107 105 105  --   CO2 21* 17* 25 26  --   GLUCOSE 128* 129* 103* 92  --   BUN 14 14 12 15   --   CREATININE 1.10 1.15 1.05 1.10  --   CALCIUM 8.9 8.8* 8.8* 8.7*  --   MG 2.0  --   --   --   --    GFR: Estimated Creatinine Clearance: 75.6 mL/min (by C-G formula based on SCr of 1.1 mg/dL). Liver Function Tests: Recent Labs  Lab 11/10/20 0254 11/10/20 1649  AST 37 48*  ALT 32 34  ALKPHOS 75 70  BILITOT 1.0 1.4*  PROT 6.2* 6.2*  ALBUMIN 3.8 3.7   No results for input(s): LIPASE, AMYLASE in the last 168 hours. No results for input(s): AMMONIA in the last 168 hours. Coagulation Profile: Recent Labs  Lab 11/13/20 1209  INR 1.1   Cardiac Enzymes: No results for input(s): CKTOTAL, CKMB, CKMBINDEX, TROPONINI in the last 168 hours. BNP (last 3 results) No results for input(s): PROBNP in the last 8760 hours. HbA1C: No results for input(s): HGBA1C in the last 72 hours. CBG: Recent Labs  Lab 11/10/20 1538  GLUCAP 160*   Lipid Profile: No results for input(s): CHOL, HDL, LDLCALC, TRIG, CHOLHDL, LDLDIRECT in the last 72 hours. Thyroid Function Tests: No results for input(s): TSH, T4TOTAL, FREET4, T3FREE, THYROIDAB in the last 72 hours. Anemia Panel: No results for input(s): VITAMINB12, FOLATE, FERRITIN, TIBC, IRON, RETICCTPCT in the last 72 hours. Sepsis Labs: No results for input(s): PROCALCITON, LATICACIDVEN in the last  168 hours.  Recent Results (from the past 240 hour(s))  Resp Panel by RT-PCR (Flu A&B, Covid) Nasopharyngeal Swab     Status: None   Collection Time: 11/11/20  1:50 PM   Specimen: Nasopharyngeal Swab; Nasopharyngeal(NP) swabs in vial transport medium  Result Value Ref Range Status   SARS Coronavirus 2 by RT PCR NEGATIVE NEGATIVE Final    Comment: (NOTE) SARS-CoV-2 target nucleic acids are NOT DETECTED.  The SARS-CoV-2 RNA is generally detectable in upper respiratory specimens during the acute phase of infection. The lowest concentration of SARS-CoV-2 viral copies this assay can detect is 138 copies/mL. A negative result does not preclude SARS-Cov-2 infection and should not be used as the sole basis for treatment or other patient management decisions. A negative result may occur with  improper specimen collection/handling, submission of specimen other than nasopharyngeal swab, presence of viral mutation(s) within the areas targeted by this assay, and inadequate number of viral copies(<138 copies/mL). A negative result must be combined with clinical observations, patient history, and epidemiological information. The expected result is Negative.  Fact Sheet for Patients:  BloggerCourse.com  Fact Sheet for Healthcare Providers:  SeriousBroker.it  This test is no t yet approved or cleared by the [place] FDA and  has been authorized for detection and/or diagnosis of SARS-CoV-2 by FDA under an Emergency Use Authorization (EUA). This EUA will remain  in effect (meaning this test can be used) for the duration of the COVID-19 declaration under Section 564(b)(1) of the Act, 21 U.S.C.section 360bbb-3(b)(1), unless the authorization is terminated  or revoked sooner.       Influenza A by PCR NEGATIVE NEGATIVE Final   Influenza B by PCR NEGATIVE NEGATIVE Final    Comment: (NOTE) The Xpert Xpress SARS-CoV-2/FLU/RSV plus assay is  intended as an aid in the diagnosis of influenza from Nasopharyngeal swab specimens and should not be used as a sole basis for treatment. Nasal washings and aspirates are unacceptable for Xpert Xpress SARS-CoV-2/FLU/RSV testing.  Fact Sheet for Patients: BloggerCourse.com  Fact Sheet for Healthcare Providers: SeriousBroker.it  This test is not yet approved or cleared by the [place] FDA and has been authorized for detection and/or diagnosis of SARS-CoV-2 by FDA under an Emergency Use Authorization (EUA). This EUA will remain in effect (meaning this test can be used) for the duration of the COVID-19 declaration under Section 564(b)(1) of the Act, 21 U.S.C. section 360bbb-3(b)(1), unless the authorization is terminated or revoked.  Performed at [facility], [address] [place], [place] [address]  Radiology Studies: No results found.      Scheduled Meds:  bupivacaine       bupivacaine       docusate sodium  100 mg Oral BID   levETIRAcetam  500 mg Oral BID   lidocaine  1 patch Transdermal Q24H   pantoprazole  40 mg Oral Daily   tobramycin       Continuous Infusions:  sodium chloride 50 mL/hr at 11/14/20 1049   methocarbamol (ROBAXIN) IV 500 mg (11/14/20 0333)    Assessment & Plan:   Principal Problem:   Compression fracture of lumbar vertebra (HCC) Active Problems:   Seizure disorder (HCC)   Marijuana dependence (HCC)   T12, L2 compression fractures-IR consulted on admission, awaiting input.  For now continue conservative management with TLSO, pain control -PT evaluation pending PSA normal 7/14 s/p kyphoplasty to day by IR Will need to lay flat for 3 hrs post IR instructions PT evaluation later Pain control.    Active Problems Seizure disorder -several episodes of apparent aura episodes, with 2 seizure episodes on 7/10 and 7/11.  He has since been started on Keppra, now  seizure-free.  A brain MRI was obtained on his initial presentation on 7/10 which was normal 7/14-EEG nmml Continue keppra Will need to f/u with outpt neurology   Marijuana dependence - Cessation was encouraged     DVT prophylaxis: SCD Code Status: Full Family Communication: Family at bedside Disposition Plan:  Status is: Inpatient  Remains inpatient appropriate because:Inpatient level of care appropriate due to severity of illness  Dispo: The patient is from: Home              Anticipated d/c is to: Home              Patient currently is not medically stable to d/c.   Difficult to place patient No            LOS: 3 days   Time spent: 35 minutes with more than 50% on COC    Lynn Ito, MD [facility] Pager [phone]  If 7PM-7AM, please contact night-coverage 11/14/2020, 5:04 PM

## 2020-11-14 NOTE — Plan of Care (Signed)

## 2020-11-14 NOTE — Progress Notes (Signed)
Patient left floor to IR with transport awake,alert/orientedx4.

## 2020-11-14 NOTE — Progress Notes (Signed)
PT Cancellation Note  Patient Details Name: Ricardo Stout MRN: [id number] DOB: [birth year]   Cancelled Treatment:    Reason Eval/Treat Not Completed: Patient at procedure or test/unavailable. Pt of floor at IR for kyphoplasty. PT to return as able to progress mobility.  Lewis Shock, PT, DPT Acute Rehabilitation Services Pager #: [phone] Office #: [phone]    [name] 11/14/2020, 8:48 AM

## 2020-11-14 NOTE — Procedures (Signed)
S/P T 12 and L2 balloon KP S.Ruel Dimmick MD 

## 2020-11-14 NOTE — Discharge Instructions (Signed)
1.No stooping,or bending or lifting more than 10 lbs for 2 weeks. 2.Use walker to ambulate for 2 weeks. 3.Avoid driving for   2 weeks. 4.RTC PRN 2 weeks

## 2020-11-15 MED ORDER — LEVETIRACETAM 500 MG PO TABS: 500.0000 mg | ORAL_TABLET | Freq: Two times a day (BID) | ORAL | 2 refills | Status: DC

## 2020-11-15 NOTE — TOC Transition Note (Addendum)
Transition of Care [facility]) - CM/SW Discharge Note   Patient Details  Name: Hagen Bohorquez MRN: [id number] Date of Birth: [birth year]  Transition of Care [facility]) CM/SW Contact:  Epifanio Lesches, RN Phone Number: 11/15/2020, 10:03 AM   Clinical Narrative:    Patient will DC ID:[id number] Anticipated DC date: 11/15/2020 Family notified:yes, wife Transport by: Sharin Mons          -s/p kyphoplasty, 7/14 Per MD patient ready for DC today . RN, patient, and patient's family notified of DC. DME, rolling walker will be delivered to beside prior to d/c by [employer] [phone]). Pt states not interested in home health services if needed, wife to assist with care. Post hospital f/u noted on AVS. Pt without Rx med concerns. Wife to provide transportation to home.  RNCM will sign off for now as intervention is no longer needed. Please consult [place] again if new needs arise.    Final next level of care: Home/Self Care Barriers to Discharge: No Barriers Identified   Patient Goals and CMS Choice     Choice offered to / list presented to : Patient  Discharge Placement                       Discharge Plan and Services                DME Arranged: Walker rolling, 3in1/BSC DME Agency: [employer] Date DME Agency Contacted: 11/15/20 Time DME Agency Contacted: 1002 Representative spoke with at DME Agency: Velna Hatchet            Social Determinants of Health (SDOH) Interventions     Readmission Risk Interventions No flowsheet data found.

## 2020-11-15 NOTE — Progress Notes (Signed)
PT Cancellation Note  Patient Details Name: Ricardo Stout MRN: [id number] DOB: [birth year]   Cancelled Treatment:    Reason Eval/Treat Not Completed: Other (comment).  Pt has been up today without his brace, which is not required any more.  Pt is leaving soon, and has been up with his wife walking on RW.  Declines PT for now, agreed to ask PT to come back if he changes his mind.  Follow up tomorrow if dc is held for some reason.   Ivar Drape 11/15/2020, 2:45 PM  Samul Dada, PT MS Acute Rehab Dept. Number: [facility] [id number] and [facility] [phone]

## 2020-11-15 NOTE — Progress Notes (Signed)
Ricardo Stout is a 60 year old male s/p T12 and L2 balloon kyphoplasty with Dr. Corliss Skains on 11/14/2020.  Patient was evaluated at the bedside.  Patient is standing next to bed, using a 2 wheel walker.  Patient's wife, Ricardo Stout, is helping Ricardo Stout to ambulate.  He is not on TLSO brace anymore. Patient states that he was finally able to get up and get out of the bed yesterday after the kyphoplasty, has been able to ambulate with a walker. States that the back pain is already significantly better, did not need any pain medicine since the kyphoplasty. Patient asked if he still needs to wear the TLSO brace, the brace is not needed from IR standpoint, however, patient is more than welcome to wear it for support as needed. Patient states that he is awaiting on PT evaluation and possibly will be discharged later today. Ricardo Stout asked if the dressing on patient's back can come off as they are already half way off.  The puncture sites appear well-healed, no erythema, TTP, drainage or bleeding noted.  Inform her that the dressing can be changed to regular bandage at this time. Discussed wound care, keep the puncture site clean and dry, may take a shower with bandage on, remove the bandage right after the shower and pat dry the area and place a new bandage until fully healed. No submerging (bathing, swimming) for next 7 days. Discussed post kyphoplasty instructions, no bending, stooping, lifting more than 10 pounds for 2 weeks.  No driving for next couple weeks.  Ambulate with a walker for 2 weeks.  Follow-up will be as needed basis, advised to contact [facility] IR if patient develops any problems. Patient and his wife verbalized understanding.  Please call [facility] IR for further questions and concerns. It was a pleasure meeting Ricardo Stout.  Lynann Bologna Mindi Akerson PA-C 11/15/2020 11:59 AM

## 2020-11-16 NOTE — Discharge Summary (Signed)
Physician Discharge Summary  Ricardo Stout ZOX:[id number] DOB: 05-May-1960 DOA: 11/11/2020  PCP: Ricardo Pagan, NP  Admit date: 11/11/2020 Discharge date: 11/15/2020  Admitted From: Home Discharged to: Home  Recommendations for Outpatient Follow-up:  Follow up with PCP in 1-2 weeks Follow up with Neurology for seizures      Home Health: PT OT due to pain with ambulation  Equipment/Devices: 3in1  Discharge Condition: Well  CODE STATUS: FULL Diet recommendation: Regular  Brief/Interim Summary: Ricardo Stout is a 60 y.o. M with no significant PMHx who presented with seizure and found to have compression fractures.  The patient was recently evaluated by neurology for characteristic spells, which were thought to be possibly seizures, but his work-up was pending.  The day of admission, the patient had a generalized tonic-clonic seizure witnessed by his wife while sleeping, came to the ER and was discharged home, then returned within 24 hours after having another seizure at home.  In the ER, he was found to have a T12 and L2 compression fracture and he was placed in a TLSO and admitted to the hospital.       PRINCIPAL HOSPITAL DIAGNOSIS: Thoracic and lumbar compression fractures    Discharge Diagnoses:  T12 and L2 compression fractures Patient was admitted, evaluated by neurosurgery and interventional radiology.  He underwent balloon kyphoplasty and afterwards his pain improved.   New onset seizures The patient had 2 seizures within 24 hours, but had been preceded by several stereotypic episodes which were suspicious for seizures by his neurologist.    In the hospital he underwent MRI and EEG which were both normal.  His previous episodes he was started on Keppra 500 twice daily which he tolerated well, and discharged with close neurology follow-up.            Discharge Instructions  Discharge Instructions     Discharge instructions   Complete by: As  directed    Take Keppra 500 mg twice daily Go see Dr. Marjory Lies as soon as you are able  Do not drive for 6 months until you are seizure free   Increase activity slowly   Complete by: As directed    No wound care   Complete by: As directed       Allergies as of 11/15/2020   No Known Allergies      Medication List     TAKE these medications    ibuprofen 200 MG tablet Commonly known as: ADVIL Take 200 mg by mouth every 6 (six) hours as needed (pain).   levETIRAcetam 500 MG tablet Commonly known as: Keppra Take 1 tablet (500 mg total) by mouth 2 (two) times daily.   ondansetron 4 MG disintegrating tablet Commonly known as: Zofran ODT Take 1 tablet (4 mg total) by mouth every 8 (eight) hours as needed for nausea or vomiting.       ASK your doctor about these medications    oxyCODONE-acetaminophen 5-325 MG tablet Commonly known as: Percocet Take 1 tablet by mouth every 6 (six) hours as needed for up to 5 days for severe pain. Ask about: Should I take this medication?        Follow-up Information     Ricardo  FALSTREAU,PA / [facility]. Schedule an appointment as soon as possible for a visit.   Contact information: [phone]               No Known Allergies  Consultations: Neurosurgery Interventional radiology   Procedures/Studies: CT Head Wo Contrast  Result  Date: 11/10/2020 CLINICAL DATA:  60 year old male with dizziness. EXAM: CT HEAD WITHOUT CONTRAST TECHNIQUE: Contiguous axial images were obtained from the base of the skull through the vertex without intravenous contrast. COMPARISON:  None. FINDINGS: Brain: The ventricles and sulci appropriate size for patient's age. The gray-white matter discrimination is preserved. There is no acute intracranial hemorrhage. No mass effect or midline shift. No extra-axial fluid collection. Vascular: No hyperdense vessel or unexpected calcification. Skull: Normal. Negative for fracture or focal lesion.  Sinuses/Orbits: There is mucoperiosteal thickening of paranasal sinuses with opacification of several ethmoid air cells. The mastoid air cells are clear. Air-fluid level. Other: None IMPRESSION: 1. Unremarkable noncontrast CT of the brain. 2. Paranasal sinus disease. Electronically Signed   By: Elgie Collard M.D.   On: 11/10/2020 03:21   CT Lumbar Spine Wo Contrast  Result Date: 11/10/2020 CLINICAL DATA:  Initial evaluation for low back pain, trauma. EXAM: CT LUMBAR SPINE WITHOUT CONTRAST TECHNIQUE: Multidetector CT imaging of the lumbar spine was performed without intravenous contrast administration. Multiplanar CT image reconstructions were also generated. COMPARISON:  None. FINDINGS: Segmentation: Standard. Lowest well-formed disc space labeled the L5-S1 level. Alignment: Physiologic with preservation of the normal lumbar lordosis. No listhesis. Vertebrae: Mild compression deformity involving the superior endplate of T11 with no more than mild 10% central height loss but no bony retropulsion, age indeterminate. Acute burst type compression fracture involving the T12 vertebral body with up to 35% height loss and 4 mm bony retropulsion. Suspected additional subtle acute compression fracture involving the superior endplate of L1 with minimal height loss but no bony retropulsion. Acute burst type compression fracture involving the superior endplate of L2 with up to 25% height loss with trace 2 mm bony retropulsion. Compression deformity involving the inferior endplate of L4 with up to 35% height loss without bony retropulsion, chronic in appearance. No other acute fracture. Visualized sacrum and pelvis intact. No discrete osseous lesions. Paraspinal and other soft tissues: Mild paraspinous edema adjacent to the acute compression fractures. Paraspinous soft tissues demonstrate no other acute finding. Mild aorto bi-iliac atherosclerotic disease. Disc levels: T11-12: 4 mm bony retropulsion related to the acute  T12 compression fracture. No significant spinal stenosis. Foramina remain patent. T12-L1: Unremarkable. L1-2: Trace 2 mm bony retropulsion related to the L2 compression fracture. No significant spinal stenosis. Foramina remain patent. L2-3:  Negative interspace.  Mild facet hypertrophy.  No stenosis. L3-4: Mild disc bulge. Superimposed left foraminal disc protrusion contacts the exiting left L3 nerve root (series 3, image 102). Mild facet hypertrophy. No spinal stenosis. Mild left foraminal narrowing. L4-5: Mild disc bulge. Superimposed broad base right subarticular to foraminal disc protrusion, potentially affecting either the exiting right L4 or descending L5 nerve roots (series 3, image 118). Mild facet hypertrophy. No significant spinal stenosis. Mild left with mild to moderate right L4 foraminal narrowing. L5-S1: Mild disc bulge, eccentric to the left. 8 mm soft tissue density at the level of the left neural foramen likely reflects a small extruded disc fragment (series 3, image 133). This closely approximates and could potentially affect the exiting left L5 nerve root. Mild facet hypertrophy. No significant spinal stenosis. Mild left foraminal narrowing. IMPRESSION: 1. Acute compression fractures involving the T12 and L2 vertebral bodies, with additional possible subtle compression fracture involving the superior endplate of L1 as above. 2. Mild compression deformity involving the superior endplate of T11, age indeterminate, and could be chronic in nature. 3. Mild compression deformity involving the inferior endplate of L4, likely chronic. 4.  Left foraminal disc protrusion at L3-4, contacting and potentially affecting the exiting left L3 nerve root. 5. Broad right subarticular to foraminal disc protrusion at L4-5, potentially affecting either the exiting right L4 or descending L5 nerve roots. 6. 8 mm soft tissue density at the level of the left neural foramen at L5-S1, likely a small extruded disc fragment.  This closely approximates the exiting left L5 nerve root. Electronically Signed   By: Rise MuBenjamin  McClintock M.D.   On: 11/10/2020 04:04   MR BRAIN W WO CONTRAST  Result Date: 11/10/2020 CLINICAL DATA:  Seizure-like activity. Chronic headache. Neuro deficit with stroke suspected. EXAM: MRI HEAD WITHOUT AND WITH CONTRAST TECHNIQUE: Multiplanar, multiecho pulse sequences of the brain and surrounding structures were obtained without and with intravenous contrast. CONTRAST:  7.305mL GADAVIST GADOBUTROL 1 MMOL/ML IV SOLN COMPARISON:  Head CT from earlier today FINDINGS: Brain: No acute infarction, hemorrhage, hydrocephalus, extra-axial collection or mass lesion. No cortical finding to explain seizure. No abnormal enhancement. Vascular: Normal flow voids. Skull and upper cervical spine: Normal marrow signal. Sinuses/Orbits: Mild patchy opacification of paranasal sinuses and left more than right mastoids. Other: Mild motion artifact IMPRESSION: Normal MRI of the brain. Electronically Signed   By: Marnee SpringJonathon  Watts M.D.   On: 11/10/2020 05:39   DG Chest Portable 1 View  Result Date: 11/10/2020 CLINICAL DATA:  Recent seizure-like activity EXAM: PORTABLE CHEST 1 VIEW COMPARISON:  None. FINDINGS: Cardiac shadow is within normal limits. The lungs are well aerated bilaterally. Some patchy airspace opacity is noted in the right upper lobe which may represent some early infiltrate. No bony abnormality is seen. IMPRESSION: Patchy airspace opacity in the right upper lobe suggestive of early infiltrate. Electronically Signed   By: Alcide CleverMark  Lukens M.D.   On: 11/10/2020 03:00   DG Shoulder Left  Result Date: 11/10/2020 CLINICAL DATA:  Recent seizure-like activity with shoulder pain, initial encounter EXAM: LEFT SHOULDER - 2+ VIEW COMPARISON:  None. FINDINGS: Degenerative changes of the acromioclavicular joint are seen. No acute fracture or dislocation is seen. The underlying bony thorax is within normal limits. No soft tissue  abnormality is seen. IMPRESSION: Degenerative change without acute abnormality. Electronically Signed   By: Alcide CleverMark  Lukens M.D.   On: 11/10/2020 02:59   DG Shoulder Left Portable  Result Date: 11/10/2020 CLINICAL DATA:  Left shoulder pain post suture. EXAM: LEFT SHOULDER COMPARISON:  None. FINDINGS: Minimal degenerative changes of the [facility] joint and glenohumeral joints. No acute fracture or dislocation. IMPRESSION: 1. No acute findings. 2. Mild degenerative changes. Electronically Signed   By: Elberta Fortisaniel  Boyle M.D.   On: 11/10/2020 16:38   DG Shoulder Right Portable  Result Date: 11/10/2020 CLINICAL DATA:  Right shoulder pain post seizure. EXAM: PORTABLE RIGHT SHOULDER COMPARISON:  None. FINDINGS: Minimal degenerative changes of the [facility] joint and glenohumeral joint. No acute fracture or dislocation. IMPRESSION: No acute findings. Electronically Signed   By: Elberta Fortisaniel  Boyle M.D.   On: 11/10/2020 16:37   EEG adult  Result Date: 11/12/2020 Charlsie QuestYadav, Priyanka O, MD     11/12/2020 12:45 PM Patient Name: Ricardo MelterRussell Kolek MRN: [id number] Epilepsy Attending: Charlsie QuestPriyanka O Yadav Referring Physician/Provider: Dr Pamella Pertostin Gherghe Date: 11/12/2020 Duration: 23.51 mins Patient history: 60 year old male with history of epilepsy who presented with back pain.  EEG to evaluate for seizures. Level of alertness: Awake, drowsy AEDs during EEG study: LEV Technical aspects: This EEG study was done with scalp electrodes positioned according to the 10-20 International system of electrode placement. Electrical activity was acquired  at a sampling rate of  and reviewed with a high frequency filter of  and a low frequency filter of . EEG data were recorded continuously and digitally stored. Description: The posterior dominant rhythm consists of 9 Hz activity of moderate voltage (25-35 uV) seen predominantly in posterior head regions, symmetric and reactive to eye opening and eye closing. Drowsiness was characterized by attenuation of the  posterior background rhythm.   Hyperventilation and photic stimulation were not performed.   IMPRESSION: This study is within normal limits. No seizures or epileptiform discharges were seen throughout the recording. Priyanka O Yadav   IR KYPHO LUMBAR INC FX REDUCE BONE BX UNI/BIL CANNULATION INC/IMAGING  Result Date: 11/14/2020 INDICATION: Severe low back pain secondary to compression fractures at T12 and L2. EXAM: BALLOON KYPHOPLASTY AT T12 AND L2 COMPARISON:  CT lumbosacral spine of a few days ago. MEDICATIONS: As antibiotic prophylaxis, Ancef 2 g IV was ordered pre-procedure and administered intravenously within 1 hour of incision. ANESTHESIA/SEDATION: Moderate (conscious) sedation was employed during this procedure. A total of Versed 2 mg and Fentanyl 100 mcg, and 1 mg of Dilaudid was administered intravenously. Moderate Sedation Time: 47 minutes. The patient's level of consciousness and vital signs were monitored continuously by radiology nursing throughout the procedure under my direct supervision. FLUOROSCOPY TIME:  Fluoroscopy Time: 16 minutes 24 seconds (1672 mGy) COMPLICATIONS: None immediate. PROCEDURE: Following a full explanation of the procedure along with the potential associated complications, an informed witnessed consent was obtained. The patient was placed prone on the fluoroscopic table. The skin overlying the thoracolumbar region was then prepped and draped in the usual sterile fashion. The right pedicle at L2, and the left pedicle at T12 were then infiltrated with 0.25% bupivacaine followed by the advancement of an 11-gauge Jamshidi needle through the right pedicle at L2 and the left pedicle at T12 into the posterior one-third at these 2 levels. These were then exchanged for a Kyphon advanced osteo introducer system comprised of a working cannula and a Kyphon osteo drill at each level. The combinations were then advanced over a Kyphon osteo bone pin until the tips of the Kyphon osteo drill  were in the posterior third at each level. At this time, the bone pins were removed. In a medial trajectory, the combinations were advanced until the tips of the working cannulae were inside the posterior one-third at each level. The osteo drills were removed. Through the working cannulae, a Kyphon inflatable bone tamp 20 x 3 was advanced and positioned with the distal marker 5 mm from the anterior aspect of T12 and L2. Crossing of the midline was seen on the AP projection. At this time, the balloons were expanded using contrast via a Kyphon inflation syringe device via microtubing. Inflations were continued until there was apposition with the superior and the inferior endplates at both levels. At this time, methylmethacrylate mixture was reconstituted with Tobramycin in the Kyphon bone mixing device system. This was then loaded onto the Kyphon bone fillers. The balloon was deflated and removed followed by the instillation of 5 bone filler equivalents of methylmethacrylate mixture at T12, and 4-1/2 bone filler equivalents of methylmethacrylate mixture at L2 with excellent filling in the AP and lateral projections. No extravasation was noted in the disk spaces or posteriorly into the spinal canal. A small sliver of methylmethacrylate mixture was noted in a thin anterior paraspinous vein at T12. This remained stable throughout the procedure. The working cannulae and the bone fillers were then retrieved and removed.  Hemostasis was obtained at the skin entry sites. Patient tolerated the procedure well. IMPRESSION: 1. Status post vertebral body augmentation using balloon kyphoplasty at T12 and L2 as described without event. Electronically Signed   By: Julieanne Cotton M.D.   On: 11/14/2020 10:24   IR KYPHO EA ADDL LEVEL THORACIC OR LUMBAR  Result Date: 11/14/2020 INDICATION: Severe low back pain secondary to compression fractures at T12 and L2. EXAM: BALLOON KYPHOPLASTY AT T12 AND L2 COMPARISON:  CT lumbosacral  spine of a few days ago. MEDICATIONS: As antibiotic prophylaxis, Ancef 2 g IV was ordered pre-procedure and administered intravenously within 1 hour of incision. ANESTHESIA/SEDATION: Moderate (conscious) sedation was employed during this procedure. A total of Versed 2 mg and Fentanyl 100 mcg, and 1 mg of Dilaudid was administered intravenously. Moderate Sedation Time: 47 minutes. The patient's level of consciousness and vital signs were monitored continuously by radiology nursing throughout the procedure under my direct supervision. FLUOROSCOPY TIME:  Fluoroscopy Time: 16 minutes 24 seconds (1672 mGy) COMPLICATIONS: None immediate. PROCEDURE: Following a full explanation of the procedure along with the potential associated complications, an informed witnessed consent was obtained. The patient was placed prone on the fluoroscopic table. The skin overlying the thoracolumbar region was then prepped and draped in the usual sterile fashion. The right pedicle at L2, and the left pedicle at T12 were then infiltrated with 0.25% bupivacaine followed by the advancement of an 11-gauge Jamshidi needle through the right pedicle at L2 and the left pedicle at T12 into the posterior one-third at these 2 levels. These were then exchanged for a Kyphon advanced osteo introducer system comprised of a working cannula and a Kyphon osteo drill at each level. The combinations were then advanced over a Kyphon osteo bone pin until the tips of the Kyphon osteo drill were in the posterior third at each level. At this time, the bone pins were removed. In a medial trajectory, the combinations were advanced until the tips of the working cannulae were inside the posterior one-third at each level. The osteo drills were removed. Through the working cannulae, a Kyphon inflatable bone tamp 20 x 3 was advanced and positioned with the distal marker 5 mm from the anterior aspect of T12 and L2. Crossing of the midline was seen on the AP projection. At  this time, the balloons were expanded using contrast via a Kyphon inflation syringe device via microtubing. Inflations were continued until there was apposition with the superior and the inferior endplates at both levels. At this time, methylmethacrylate mixture was reconstituted with Tobramycin in the Kyphon bone mixing device system. This was then loaded onto the Kyphon bone fillers. The balloon was deflated and removed followed by the instillation of 5 bone filler equivalents of methylmethacrylate mixture at T12, and 4-1/2 bone filler equivalents of methylmethacrylate mixture at L2 with excellent filling in the AP and lateral projections. No extravasation was noted in the disk spaces or posteriorly into the spinal canal. A small sliver of methylmethacrylate mixture was noted in a thin anterior paraspinous vein at T12. This remained stable throughout the procedure. The working cannulae and the bone fillers were then retrieved and removed. Hemostasis was obtained at the skin entry sites. Patient tolerated the procedure well. IMPRESSION: 1. Status post vertebral body augmentation using balloon kyphoplasty at T12 and L2 as described without event. Electronically Signed   By: Julieanne Cotton M.D.   On: 11/14/2020 10:24      Subjective: Feeling well.  Back pain is tolerable.  No seizures.  No fever, headache, chest pain.  Discharge Exam: Vitals:   11/14/20 2125 11/15/20 0757  BP: (!) 141/86 135/83  Pulse: 60 62  Resp: 18 16  Temp: 98.5 F (36.9 C) 98.1 F (36.7 C)  SpO2: 97% 97%   Vitals:   11/14/20 1051 11/14/20 1502 11/14/20 2125 11/15/20 0757  BP: 137/84 (!) 132/95 (!) 141/86 135/83  Pulse: (!) 56 61 60 62  Resp: Temp:  98.7 F (37.1 C) 98.5 F (36.9 C) 98.1 F (36.7 C)  TempSrc:  Oral  Oral  SpO2: 98% 96% 97% 97%  Weight:      Height:        General: Pt is alert, awake, not in acute distress Cardiovascular: RRR, nl S1-S2, no murmurs appreciated.   No LE edema.    Respiratory: Normal respiratory rate and rhythm.  CTAB without rales or wheezes. Abdominal: Abdomen soft and non-tender.  No distension or HSM.   Neuro/Psych: Strength symmetric in upper and lower extremities.  Judgment and insight appear normal.   The results of significant diagnostics from this hospitalization (including imaging, microbiology, ancillary and laboratory) are listed below for reference.     Microbiology: Recent Results (from the past 240 hour(s))  Resp Panel by RT-PCR (Flu A&B, Covid) Nasopharyngeal Swab     Status: None   Collection Time: 11/11/20  1:50 PM   Specimen: Nasopharyngeal Swab; Nasopharyngeal(NP) swabs in vial transport medium  Result Value Ref Range Status   SARS Coronavirus 2 by RT PCR NEGATIVE NEGATIVE Final    Comment: (NOTE) SARS-CoV-2 target nucleic acids are NOT DETECTED.  The SARS-CoV-2 RNA is generally detectable in upper respiratory specimens during the acute phase of infection. The lowest concentration of SARS-CoV-2 viral copies this assay can detect is 138 copies/mL. A negative result does not preclude SARS-Cov-2 infection and should not be used as the sole basis for treatment or other patient management decisions. A negative result may occur with  improper specimen collection/handling, submission of specimen other than nasopharyngeal swab, presence of viral mutation(s) within the areas targeted by this assay, and inadequate number of viral copies(<138 copies/mL). A negative result must be combined with clinical observations, patient history, and epidemiological information. The expected result is Negative.  Fact Sheet for Patients:  BloggerCourse.com  Fact Sheet for Healthcare Providers:  SeriousBroker.it  This test is no t yet approved or cleared by the [place] FDA and  has been authorized for detection and/or diagnosis of SARS-CoV-2 by FDA under an Emergency Use Authorization  (EUA). This EUA will remain  in effect (meaning this test can be used) for the duration of the COVID-19 declaration under Section 564(b)(1) of the Act, 21 U.S.C.section 360bbb-3(b)(1), unless the authorization is terminated  or revoked sooner.       Influenza A by PCR NEGATIVE NEGATIVE Final   Influenza B by PCR NEGATIVE NEGATIVE Final    Comment: (NOTE) The Xpert Xpress SARS-CoV-2/FLU/RSV plus assay is intended as an aid in the diagnosis of influenza from Nasopharyngeal swab specimens and should not be used as a sole basis for treatment. Nasal washings and aspirates are unacceptable for Xpert Xpress SARS-CoV-2/FLU/RSV testing.  Fact Sheet for Patients: BloggerCourse.com  Fact Sheet for Healthcare Providers: SeriousBroker.it  This test is not yet approved or cleared by the [place] FDA and has been authorized for detection and/or diagnosis of SARS-CoV-2 by FDA under an Emergency Use Authorization (EUA). This EUA will remain in effect (meaning this test  can be used) for the duration of the COVID-19 declaration under Section 564(b)(1) of the Act, 21 U.S.C. section 360bbb-3(b)(1), unless the authorization is terminated or revoked.  Performed at [facility], [address] [place], [place] [address]      Labs: BNP (last 3 results) No results for input(s): BNP in the last 8760 hours. Basic Metabolic Panel: Recent Labs  Lab 11/10/20 0254 11/10/20 1649 11/11/20 1350 11/12/20 0316 11/14/20 0213  NA 136 136 136 135  --   K 3.7 4.4 4.0 4.9 3.8  CL 106 107 105 105  --   CO2 21* 17* 25 26  --   GLUCOSE 128* 129* 103* 92  --   BUN 14 14 12 15   --   CREATININE 1.10 1.15 1.05 1.10  --   CALCIUM 8.9 8.8* 8.8* 8.7*  --   MG 2.0  --   --   --   --    Liver Function Tests: Recent Labs  Lab 11/10/20 0254 11/10/20 1649  AST 37 48*  ALT 32 34  ALKPHOS 75 70  BILITOT 1.0 1.4*  PROT 6.2* 6.2*  ALBUMIN 3.8 3.7    No results for input(s): LIPASE, AMYLASE in the last 168 hours. No results for input(s): AMMONIA in the last 168 hours. CBC: Recent Labs  Lab 11/10/20 0254 11/10/20 1649 11/11/20 1350 11/12/20 0316 11/14/20 0213  WBC 21.1* 19.4* 12.0* 8.9 6.0  NEUTROABS 18.7* 17.5* 10.0*  --  3.3  HGB 15.4 15.3 13.8 13.2 14.2  HCT 44.8 44.6 41.1 38.6* 40.5  MCV 95.1 95.9 96.9 95.5 92.0  PLT 218 204 148* 154 158   Cardiac Enzymes: No results for input(s): CKTOTAL, CKMB, CKMBINDEX, TROPONINI in the last 168 hours. BNP: Invalid input(s): POCBNP CBG: Recent Labs  Lab 11/10/20 1538  GLUCAP 160*   D-Dimer No results for input(s): DDIMER in the last 72 hours. Hgb A1c No results for input(s): HGBA1C in the last 72 hours. Lipid Profile No results for input(s): CHOL, HDL, LDLCALC, TRIG, CHOLHDL, LDLDIRECT in the last 72 hours. Thyroid function studies No results for input(s): TSH, T4TOTAL, T3FREE, THYROIDAB in the last 72 hours.  Invalid input(s): FREET3 Anemia work up No results for input(s): VITAMINB12, FOLATE, FERRITIN, TIBC, IRON, RETICCTPCT in the last 72 hours. Urinalysis No results found for: COLORURINE, APPEARANCEUR, LABSPEC, PHURINE, GLUCOSEU, HGBUR, BILIRUBINUR, KETONESUR, PROTEINUR, UROBILINOGEN, NITRITE, LEUKOCYTESUR Sepsis Labs Invalid input(s): PROCALCITONIN,  WBC,  LACTICIDVEN Microbiology Recent Results (from the past 240 hour(s))  Resp Panel by RT-PCR (Flu A&B, Covid) Nasopharyngeal Swab     Status: None   Collection Time: 11/11/20  1:50 PM   Specimen: Nasopharyngeal Swab; Nasopharyngeal(NP) swabs in vial transport medium  Result Value Ref Range Status   SARS Coronavirus 2 by RT PCR NEGATIVE NEGATIVE Final    Comment: (NOTE) SARS-CoV-2 target nucleic acids are NOT DETECTED.  The SARS-CoV-2 RNA is generally detectable in upper respiratory specimens during the acute phase of infection. The lowest concentration of SARS-CoV-2 viral copies this assay can detect is 138  copies/mL. A negative result does not preclude SARS-Cov-2 infection and should not be used as the sole basis for treatment or other patient management decisions. A negative result may occur with  improper specimen collection/handling, submission of specimen other than nasopharyngeal swab, presence of viral mutation(s) within the areas targeted by this assay, and inadequate number of viral copies(<138 copies/mL). A negative result must be combined with clinical observations, patient history, and epidemiological information. The expected result  is Negative.  Fact Sheet for Patients:  BloggerCourse.com  Fact Sheet for Healthcare Providers:  SeriousBroker.it  This test is no t yet approved or cleared by the [place] FDA and  has been authorized for detection and/or diagnosis of SARS-CoV-2 by FDA under an Emergency Use Authorization (EUA). This EUA will remain  in effect (meaning this test can be used) for the duration of the COVID-19 declaration under Section 564(b)(1) of the Act, 21 U.S.C.section 360bbb-3(b)(1), unless the authorization is terminated  or revoked sooner.       Influenza A by PCR NEGATIVE NEGATIVE Final   Influenza B by PCR NEGATIVE NEGATIVE Final    Comment: (NOTE) The Xpert Xpress SARS-CoV-2/FLU/RSV plus assay is intended as an aid in the diagnosis of influenza from Nasopharyngeal swab specimens and should not be used as a sole basis for treatment. Nasal washings and aspirates are unacceptable for Xpert Xpress SARS-CoV-2/FLU/RSV testing.  Fact Sheet for Patients: BloggerCourse.com  Fact Sheet for Healthcare Providers: SeriousBroker.it  This test is not yet approved or cleared by the [place] FDA and has been authorized for detection and/or diagnosis of SARS-CoV-2 by FDA under an Emergency Use Authorization (EUA). This EUA will remain in effect (meaning  this test can be used) for the duration of the COVID-19 declaration under Section 564(b)(1) of the Act, 21 U.S.C. section 360bbb-3(b)(1), unless the authorization is terminated or revoked.  Performed at [facility], [address], [place], [place] [address]      Time coordinating discharge: 25 minutes     30 Day Unplanned Readmission Risk Score    Flowsheet Row ED to Hosp-Admission (Discharged) from 11/11/2020 in [facility]  30 Day Unplanned Readmission Risk Score (%) 11.93 Filed at 11/15/2020 1600       This score is the patient's risk of an unplanned readmission within 30 days of being discharged (0 -100%). The score is based on dignosis, age, lab data, medications, orders, and past utilization.   Low:  0-14.9   Medium: 15-21.9   High: 22-29.9   Extreme: 30 and above            SIGNED:   Alberteen Sam, MD  [facility] 11/15/2020, 4:49 PM

## 2020-11-18 ENCOUNTER — Telehealth: Payer: Self-pay | Admitting: Diagnostic Neuroimaging

## 2020-11-18 NOTE — Telephone Encounter (Signed)
Pt's wife(on DPR) needs to be called re: pt having more seizure activity and wife is wanting to discuss having pt brought in before the 3 month time frame for a f/u.  Wife states pt was put on seizure medication and will need more before a 3 month time frame, please call

## 2020-11-18 NOTE — Telephone Encounter (Signed)
Per Dr Marjory Lies will refill keppra as needed before appointment. Current Rx will last until 02/15/21. Called wife and informed her Dr Marjory Lies will refill keppra as needed before follow up. Reviewed with her to call 911 if seizure lasts 5 minutes or longer, has 2 seizures back to back. She verbalized understanding, appreciation.

## 2020-11-18 NOTE — Telephone Encounter (Signed)
Contacted pt wife back, she stated pt had a bad seizure Sunday, she called EMS back on Monday because he could not move, pt had compression fractures in back due to seizures. I got pt scheduled for 12/31/20.  Any advice on what they should do in the meantime ?

## 2020-11-20 MED ORDER — LEVETIRACETAM 500 MG PO TABS: 500.0000 mg | ORAL_TABLET | Freq: Two times a day (BID) | ORAL | 6 refills | Status: DC

## 2020-11-20 NOTE — Addendum Note (Signed)
Addended by: Joycelyn Schmid R on: 11/20/2020 03:23 PM   Modules accepted: Orders

## 2020-11-20 NOTE — Telephone Encounter (Signed)
Meds ordered this encounter  Medications   levETIRAcetam (KEPPRA) 500 MG tablet    Sig: Take 1 tablet (500 mg total) by mouth 2 (two) times daily.    Dispense:  60 tablet    Refill:  6    Suanne Marker, MD 11/20/2020, 3:23 PM Certified in Neurology, Neurophysiology and Neuroimaging  [facility] [address], [address] [place], [place] [address] [phone]

## 2020-12-31 ENCOUNTER — Telehealth: Payer: Self-pay | Admitting: Diagnostic Neuroimaging

## 2020-12-31 ENCOUNTER — Ambulatory Visit (INDEPENDENT_AMBULATORY_CARE_PROVIDER_SITE_OTHER): Payer: BC Managed Care – PPO | Admitting: Diagnostic Neuroimaging

## 2020-12-31 ENCOUNTER — Encounter: Payer: Self-pay | Admitting: Diagnostic Neuroimaging

## 2020-12-31 VITALS — BP 122/83 | HR 56 | Ht 69.0 in | Wt 172.0 lb

## 2020-12-31 DIAGNOSIS — S32000S Wedge compression fracture of unspecified lumbar vertebra, sequela: Secondary | ICD-10-CM | POA: Diagnosis not present

## 2020-12-31 DIAGNOSIS — G40909 Epilepsy, unspecified, not intractable, without status epilepticus: Secondary | ICD-10-CM

## 2020-12-31 MED ORDER — LEVETIRACETAM 500 MG PO TABS: 500.0000 mg | ORAL_TABLET | Freq: Two times a day (BID) | ORAL | 4 refills | Status: DC

## 2020-12-31 MED ORDER — LEVETIRACETAM 500 MG PO TABS: 500.0000 mg | ORAL_TABLET | Freq: Two times a day (BID) | ORAL | 4 refills | Status: AC

## 2020-12-31 NOTE — Patient Instructions (Signed)
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   - Please maintain precautions. Do not participate in activities where a loss of awareness could harm you or someone else. No swimming alone, no tub bathing, no hot tubs, no driving, no operating motorized vehicles (cars, ATVs, motocycles, etc), lawnmowers, power tools or firearms. No standing at heights, such as rooftops, ladders or stairs. Avoid hot objects such as stoves, heaters, open fires. Wear a helmet when riding a bicycle, scooter, skateboard, etc. and avoid areas of traffic. Set your water heater to 120 degrees or less.

## 2020-12-31 NOTE — Telephone Encounter (Signed)
Called wife who stated to transfer to [facility] [address] for insurance to pay. I advised we can make that change.  she  verbalized understanding, appreciation.

## 2020-12-31 NOTE — Progress Notes (Signed)
[facility]  PATIENT: Ricardo Stout DOB: 04-29-1961  REFERRING CLINICIAN: Jettie Pagan, NP HISTORY FROM: patient and wife REASON FOR VISIT: follow up    HISTORICAL  CHIEF COMPLAINT:  Chief Complaint  Patient presents with   Spell of abnormal behavior    Rm 7 2 month FU, wife- [name] "had 2 seizures on 11/10/20 resulting in fractures; started on Keppra and now doing well"     HISTORY OF PRESENT ILLNESS:   UPDATE (12/31/20, VRP): Since last visit, had multiple CONVULSIVE seizures on 11/10/20. Had ilateral shoulder injuries and compression fx f T12 and L2 vertebral bodies. Now on LEV 500mg  twice a day; no more auras or seizures.   PRIOR HPI: 60 year old male here for evaluation of abnormal spells.  For past 3 to 4 months patient has had onset of abnormal sensations consisting of disconnected feeling, sweating, pale appearance, heavy breathing, lasting 30 to 60 seconds at a time.  Patient has had multiple episodes per day.  No specific triggering factors.  These can occur when sitting or resting.  A few of these episodes of occurred when he is concentrating on the computer or scrolling through his phone quickly.  He has not had any episodes last week and a half.  No prior similar events.  No family history of seizures.  No prior head injuries or traumas.   REVIEW OF SYSTEMS: Full 14 system review of systems performed and negative with exception of: as per HPI.    ALLERGIES: No Known Allergies  HOME MEDICATIONS: Outpatient Medications Prior to Visit  Medication Sig Dispense Refill   gabapentin (NEURONTIN) 100 MG capsule Take 200 mg by mouth daily.     MOBIC 7.5 MG tablet Take 7.5 mg by mouth daily.     levETIRAcetam (KEPPRA) 500 MG tablet Take 1 tablet (500 mg total) by mouth 2 (two) times daily. 60 tablet 6   ondansetron (ZOFRAN ODT) 4 MG disintegrating tablet Take 1 tablet (4 mg total) by mouth every 8 (eight) hours as needed for nausea or vomiting.  (Patient not taking: Reported on 12/31/2020) 20 tablet 0   ibuprofen (ADVIL) 200 MG tablet Take 200 mg by mouth every 6 (six) hours as needed (pain).     No facility-administered medications prior to visit.    PAST MEDICAL HISTORY: Past Medical History:  Diagnosis Date   Seizures (HCC)    most recent on 11/10/20    PAST SURGICAL HISTORY: Past Surgical History:  Procedure Laterality Date   HAND SURGERY Right    IR KYPHO EA ADDL LEVEL THORACIC OR LUMBAR  11/14/2020   IR KYPHO LUMBAR INC FX REDUCE BONE BX UNI/BIL CANNULATION INC/IMAGING  11/14/2020    FAMILY HISTORY: Family History  Problem Relation Age of Onset   Thyroid disease Mother    Cancer Father    Heart attack Father    COPD Father    Breast cancer Sister    Hypertension Sister    Seizures Maternal Grandmother     SOCIAL HISTORY: Social History   Socioeconomic History   Marital status: Married    Spouse name: [name]   Number of children: 1   Years of education: Not on file   Highest education level: Not on file  Occupational History   Occupation: IT  Tobacco Use   Smoking status: Never   Smokeless tobacco: Never  Vaping Use   Vaping Use: Never used  Substance and Sexual Activity   Alcohol use: Yes    Alcohol/week:  2.0 standard drinks    Types: 2 Cans of beer per week    Comment: or less   Drug use: Yes    Types: Marijuana    Comment: 10/07/20 daily   Sexual activity: Not on file  Other Topics Concern   Not on file  Social History Narrative   Lives with wife   'caffeine- maybe 1 soda a day   Social Determinants of Health   Financial Resource Strain: Not on file  Food Insecurity: Not on file  Transportation Needs: Not on file  Physical Activity: Not on file  Stress: Not on file  Social Connections: Not on file  Intimate Partner Violence: Not on file     PHYSICAL EXAM  GENERAL EXAM/CONSTITUTIONAL: Vitals:  Vitals:   12/31/20 0827  BP: 122/83  Pulse: (!) 56  Weight: 172 lb (78 kg)  Height:  5\' 9"  (1.753 m)   Body mass index is 25.4 kg/m. Wt Readings from Last 3 Encounters:  12/31/20 172 lb (78 kg)  11/11/20 175 lb (79.4 kg)  11/10/20 175 lb (79.4 kg)   Patient is in no distress; well developed, nourished and groomed; neck is supple  CARDIOVASCULAR: Examination of carotid arteries is normal; no carotid bruits Regular rate and rhythm, no murmurs Examination of peripheral vascular system by observation and palpation is normal  EYES: Ophthalmoscopic exam of optic discs and posterior segments is normal; no papilledema or hemorrhages No results found.  MUSCULOSKELETAL: Gait, strength, tone, movements noted in Neurologic exam below  NEUROLOGIC: MENTAL STATUS:  No flowsheet data found. awake, alert, oriented to person, place and time recent and remote memory intact normal attention and concentration language fluent, comprehension intact, naming intact fund of knowledge appropriate  CRANIAL NERVE:  2nd - no papilledema on fundoscopic exam 2nd, 3rd, 4th, 6th - pupils equal and reactive to light, visual fields full to confrontation, extraocular muscles intact, no nystagmus 5th - facial sensation symmetric 7th - facial strength symmetric 8th - hearing intact 9th - palate elevates symmetrically, uvula midline 11th - shoulder shrug symmetric 12th - tongue protrusion midline  MOTOR:  normal bulk and tone, full strength in the BUE, BLE; EXCEPT LIMITED IN BILATERAL DELTOIDS DUE TO PAIN  SENSORY:  normal and symmetric to light touch  COORDINATION:  finger-nose-finger, fine finger movements normal  REFLEXES:  deep tendon reflexes present and symmetric  GAIT/STATION:  narrow based gait     DIAGNOSTIC DATA (LABS, IMAGING, TESTING) - I reviewed patient records, labs, notes, testing and imaging myself where available.  Lab Results  Component Value Date   WBC 6.0 11/14/2020   HGB 14.2 11/14/2020   HCT 40.5 11/14/2020   MCV 92.0 11/14/2020   PLT 158  11/14/2020      Component Value Date/Time   NA 135 11/12/2020 0316   NA 136 09/11/2020 1140   K 3.8 11/14/2020 0213   CL 105 11/12/2020 0316   CO2 26 11/12/2020 0316   GLUCOSE 92 11/12/2020 0316   BUN 15 11/12/2020 0316   BUN 13 09/11/2020 1140   CREATININE 1.10 11/12/2020 0316   CALCIUM 8.7 (L) 11/12/2020 0316   PROT 6.2 (L) 11/10/2020 1649   PROT 6.9 09/11/2020 1140   ALBUMIN 3.7 11/10/2020 1649   ALBUMIN 4.8 09/11/2020 1140   AST 48 (H) 11/10/2020 1649   ALT 34 11/10/2020 1649   ALKPHOS 70 11/10/2020 1649   BILITOT 1.4 (H) 11/10/2020 1649   BILITOT 0.4 09/11/2020 1140   GFRNONAA >60 11/12/2020 0316  No results found for: CHOL, HDL, LDLCALC, LDLDIRECT, TRIG, CHOLHDL No results found for: ZJQB3A No results found for: VITAMINB12 Lab Results  Component Value Date   TSH 1.270 09/11/2020    11/10/20 MRI brain - normal  11/12/20 EEG  - normal  7/10/2 CT lumbar spine IMPRESSION: 1. Acute compression fractures involving the T12 and L2 vertebral bodies, with additional possible subtle compression fracture involving the superior endplate of L1 as above. 2. Mild compression deformity involving the superior endplate of T11, age indeterminate, and could be chronic in nature. 3. Mild compression deformity involving the inferior endplate of L4, likely chronic. 4. Left foraminal disc protrusion at L3-4, contacting and potentially affecting the exiting left L3 nerve root. 5. Broad right subarticular to foraminal disc protrusion at L4-5, potentially affecting either the exiting right L4 or descending L5 nerve roots. 6. 8 mm soft tissue density at the level of the left neural foramen at L5-S1, likely a small extruded disc fragment. This closely approximates the exiting left L5 nerve root.    ASSESSMENT AND PLAN  60 y.o. year old male here with multiple stereotyped episodes of disconnected feeling, out of body sensation, sweating, pale appearance, heavy breathing, suspicious  for complex partial seizures.  Also with generalized convulsive seizures x 2 on 11/10/20.  Dx:  1. Seizure disorder (HCC)   2. 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   - Please maintain precautions. Do not participate in activities where a loss of awareness could harm you or someone else. No swimming alone, no tub bathing, no hot tubs, no driving, no operating motorized vehicles (cars, ATVs, motocycles, etc), lawnmowers, power tools or firearms. No standing at heights, such as rooftops, ladders or stairs. Avoid hot objects such as stoves, heaters, open fires. Wear a helmet when riding a bicycle, scooter, skateboard, etc. and avoid areas of traffic. Set your water heater to 120 degrees or less.   Orders Placed This Encounter  Procedures   VITAMIN D 25 Hydroxy (Vit-D Deficiency, Fractures)   Meds ordered this encounter  Medications   levETIRAcetam (KEPPRA) 500 MG tablet    Sig: Take 1 tablet (500 mg total) by mouth 2 (two) times daily.    Dispense:  180 tablet    Refill:  4   Return in about 6 months (around 07/01/2021).  I reviewed images, labs, notes, records myself. I summarized findings and reviewed with patient, for this high risk condition (seizure) requiring high complexity decision making.    07/03/2021, MD 12/31/2020, 9:16 AM Certified in Neurology, Neurophysiology and Neuroimaging  [facility] [address] [place], [place] [phone]

## 2020-12-31 NOTE — Telephone Encounter (Signed)
Pt's wife, Raybon Conard (on [place]) called, receive a letter from [employer]. Prescription for levETIRAcetam (KEPPRA) 500 MG tablet must [facility] sent [facility] for [facility] supply to be covered by the insurance. Would like a call from the nurse to confirm prescription has been switched.

## 2021-01-01 ENCOUNTER — Telehealth: Payer: Self-pay

## 2021-01-01 LAB — VITAMIN D 25 HYDROXY (VIT D DEFICIENCY, FRACTURES): Vit D, 25-Hydroxy: 29.2 ng/mL — ABNORMAL LOW (ref 30.0–100.0)

## 2021-01-01 NOTE — Telephone Encounter (Signed)
-----   Message from Suanne Marker, MD sent at 01/01/2021 10:51 AM EDT ----- Borderline low vitamin D; consider OTC calcium + vitamin D supplement. -VRP

## 2021-01-01 NOTE — Telephone Encounter (Signed)
I called the pt and advised of results. Pt verbalized understanding. Pt is agreeable to starting otc calcium + Vit d. He will pick this up and start soon.  Pt denied any other questions/concerns.

## 2021-01-14 ENCOUNTER — Other Ambulatory Visit: Payer: Self-pay | Admitting: Orthopaedic Surgery

## 2021-01-14 ENCOUNTER — Ambulatory Visit: Payer: BC Managed Care – PPO | Admitting: Diagnostic Neuroimaging

## 2021-01-14 DIAGNOSIS — M25512 Pain in left shoulder: Secondary | ICD-10-CM

## 2021-01-14 DIAGNOSIS — M25511 Pain in right shoulder: Secondary | ICD-10-CM

## 2021-01-30 ENCOUNTER — Ambulatory Visit
Admission: RE | Admit: 2021-01-30 | Discharge: 2021-01-30 | Disposition: A | Discharge status: Home / Self Care | Payer: BC Managed Care – PPO | Source: Ambulatory Visit | Attending: Orthopaedic Surgery | Admitting: Orthopaedic Surgery

## 2021-01-30 DIAGNOSIS — M25512 Pain in left shoulder: Secondary | ICD-10-CM

## 2021-01-30 DIAGNOSIS — M25511 Pain in right shoulder: Secondary | ICD-10-CM

## 2021-04-18 ENCOUNTER — Encounter (HOSPITAL_COMMUNITY): Payer: Self-pay

## 2021-04-18 ENCOUNTER — Emergency Department (HOSPITAL_COMMUNITY)
Admission: EM | Admit: 2021-04-18 | Discharge: 2021-04-18 | Disposition: A | Discharge status: Home / Self Care | Payer: BC Managed Care – PPO | Attending: Emergency Medicine | Admitting: Emergency Medicine

## 2021-04-18 ENCOUNTER — Emergency Department (HOSPITAL_COMMUNITY): Payer: BC Managed Care – PPO

## 2021-04-18 DIAGNOSIS — Z20822 Contact with and (suspected) exposure to covid-19: Secondary | ICD-10-CM | POA: Insufficient documentation

## 2021-04-18 DIAGNOSIS — R059 Cough, unspecified: Secondary | ICD-10-CM | POA: Diagnosis present

## 2021-04-18 DIAGNOSIS — J101 Influenza due to other identified influenza virus with other respiratory manifestations: Secondary | ICD-10-CM | POA: Insufficient documentation

## 2021-04-18 LAB — CBC WITH DIFFERENTIAL/PLATELET
Abs Immature Granulocytes: 0.03 10*3/uL (ref 0.00–0.07)
Basophils Absolute: 0.1 10*3/uL (ref 0.0–0.1)
Basophils Relative: 1 %
Eosinophils Absolute: 0 10*3/uL (ref 0.0–0.5)
Eosinophils Relative: 1 %
HCT: 45 % (ref 39.0–52.0)
Hemoglobin: 15.1 g/dL (ref 13.0–17.0)
Immature Granulocytes: 0 %
Lymphocytes Relative: 8 %
Lymphs Abs: 0.7 10*3/uL (ref 0.7–4.0)
MCH: 31.5 pg (ref 26.0–34.0)
MCHC: 33.6 g/dL (ref 30.0–36.0)
MCV: 93.9 fL (ref 80.0–100.0)
Monocytes Absolute: 0.8 10*3/uL (ref 0.1–1.0)
Monocytes Relative: 9 %
Neutro Abs: 6.6 10*3/uL (ref 1.7–7.7)
Neutrophils Relative %: 81 %
Platelets: 243 10*3/uL (ref 150–400)
RBC: 4.79 MIL/uL (ref 4.22–5.81)
RDW: 12.4 % (ref 11.5–15.5)
WBC: 8.2 10*3/uL (ref 4.0–10.5)
nRBC: 0 % (ref 0.0–0.2)

## 2021-04-18 LAB — RESP PANEL BY RT-PCR (FLU A&B, COVID) ARPGX2
Influenza A by PCR: POSITIVE — AB
Influenza B by PCR: NEGATIVE
SARS Coronavirus 2 by RT PCR: NEGATIVE

## 2021-04-18 LAB — BASIC METABOLIC PANEL
Anion gap: 10 (ref 5–15)
BUN: 10 mg/dL (ref 6–20)
CO2: 21 mmol/L — ABNORMAL LOW (ref 22–32)
Calcium: 9.6 mg/dL (ref 8.9–10.3)
Chloride: 106 mmol/L (ref 98–111)
Creatinine, Ser: 1.11 mg/dL (ref 0.61–1.24)
GFR, Estimated: >60 mL/min (ref >60)
Glucose, Bld: 116 mg/dL — ABNORMAL HIGH (ref 70–99)
Potassium: 4.3 mmol/L (ref 3.5–5.1)
Sodium: 137 mmol/L (ref 135–145)

## 2021-04-18 LAB — TROPONIN I (HIGH SENSITIVITY): Troponin I (High Sensitivity): 4 ng/L (ref <18)

## 2021-04-18 MED ORDER — LEVETIRACETAM 500 MG PO TABS
500.0000 mg | ORAL_TABLET | Freq: Once | ORAL | Status: AC
  Administered 2021-04-18: 500 mg via ORAL
  Filled 2021-04-18: qty 1

## 2021-04-18 MED ORDER — ONDANSETRON 4 MG PO TBDP: ORAL_TABLET | ORAL | 0 refills | Status: AC

## 2021-04-18 MED ORDER — BENZONATATE 100 MG PO CAPS: 100.0000 mg | ORAL_CAPSULE | Freq: Three times a day (TID) | ORAL | 0 refills | Status: AC

## 2021-04-18 NOTE — ED Provider Notes (Addendum)
Emergency Medicine Provider Triage Evaluation Note  Ricardo Stout , a 60 y.o. male  was evaluated in triage.  Pt complains of chest tightness, SOB, cough since 11am this morning. Patient reports that the CP is centrally located and radiates into his neck. Patient denies exertional chest pain. Patient states his father had MI at this age. Patient also reports he has seizure disorder and he is due for his nighttime dose of Keppra. I will order for him  Review of Systems  Positive: CP, SOB, cough, dizziness, nausea, headache Negative: Vomiting, diarrhea, abdominal pain, sore throat, fevers  Physical Exam  BP (!) 147/119 (BP Location: Left Arm)    Pulse (!) 115    Temp 98.7 F (37.1 C) (Oral)    Resp (!) 22    SpO2 95%  Gen:   Awake, no distress   Resp:  Normal effort  MSK:   Moves extremities without difficulty  Other:    Medical Decision Making  Medically screening exam initiated at 8:25 PM.  Appropriate orders placed.  Ricardo Stout was informed that the remainder of the evaluation will be completed by another provider, this initial triage assessment does not replace that evaluation, and the importance of remaining in the ED until their evaluation is complete.      Al Decant, PA-C 04/18/21 2030    Bethann Berkshire, MD 04/18/21 [phone]

## 2021-04-18 NOTE — Discharge Instructions (Signed)
Take tylenol 2 pills 4 times a day and motrin 4 pills 3 times a day.  Drink plenty of fluids.  Return for worsening shortness of breath, headache, confusion. Follow up with your family doctor.   

## 2021-04-18 NOTE — ED Triage Notes (Signed)
Pt states that about an hour ago he began to have central CP with SOB, pain radiates to neck and jaw, some dizziness and nausea

## 2021-04-18 NOTE — ED Provider Notes (Signed)
[facility] EMERGENCY DEPARTMENT Provider Note   CSN: [id number] Arrival date & time: 04/18/21  2006     History Chief Complaint  Patient presents with   Chest Pain    Ricardo Stout is a 60 y.o. male.  60 yo M with a chief complaints of myalgias cough congestion fevers and chills.  This been going on just today.  Has 2 kids in his household that are both sick.  He had worsening discomfort to his jaw into his upper chest and that brought him into the ED for evaluation.  Seem to be worse with coughing.  Now is much better after sitting in the ED for a couple hours.  The history is provided by the patient and the spouse.  Chest Pain Pain location:  Substernal area Pain quality: aching   Pain radiates to:  Neck Pain severity:  Moderate Onset quality:  Gradual Duration:  2 days Timing:  Constant Progression:  Worsening Relieved by:  Nothing Worsened by:  Nothing Ineffective treatments:  None tried Associated symptoms: no abdominal pain, no fever, no headache, no palpitations, no shortness of breath and no vomiting       Past Medical History:  Diagnosis Date   Seizures (HCC)    most recent on 11/10/20    Patient Active Problem List   Diagnosis Date Noted   Compression fracture of lumbar vertebra (HCC) 11/11/2020   Seizure disorder (HCC) 11/11/2020   Marijuana dependence (HCC) 11/11/2020    Past Surgical History:  Procedure Laterality Date   HAND SURGERY Right    IR KYPHO EA ADDL LEVEL THORACIC OR LUMBAR  11/14/2020   IR KYPHO LUMBAR INC FX REDUCE BONE BX UNI/BIL CANNULATION INC/IMAGING  11/14/2020       Family History  Problem Relation Age of Onset   Thyroid disease Mother    Cancer Father    Heart attack Father    COPD Father    Breast cancer Sister    Hypertension Sister    Seizures Maternal Grandmother     Social History   Tobacco Use   Smoking status: Never   Smokeless tobacco: Never  Vaping Use   Vaping Use: Never used   Substance Use Topics   Alcohol use: Yes    Alcohol/week: 2.0 standard drinks    Types: 2 Cans of beer per week    Comment: or less   Drug use: Yes    Types: Marijuana    Comment: 10/07/20 daily    Home Medications Prior to Admission medications   Medication Sig Start Date End Date Taking? Authorizing Provider  benzonatate (TESSALON) 100 MG capsule Take 1 capsule (100 mg total) by mouth every 8 (eight) hours. 04/18/21  Yes Melene Plan, DO  ondansetron (ZOFRAN-ODT) 4 MG disintegrating tablet 4mg  ODT q4 hours prn nausea/vomit 04/18/21  Yes 04/20/21, DO  gabapentin (NEURONTIN) 100 MG capsule Take 200 mg by mouth daily. 11/18/20   [provider]  levETIRAcetam (KEPPRA) 500 MG tablet Take 1 tablet (500 mg total) by mouth 2 (two) times daily. 12/31/20   Penumalli, 01/02/21, MD  MOBIC 7.5 MG tablet Take 7.5 mg by mouth daily. 12/27/20   [provider]    Allergies    Patient has no known allergies.  Review of Systems   Review of Systems  Constitutional:  Negative for chills and fever.  HENT:  Negative for congestion and facial swelling.   Eyes:  Negative for discharge and visual disturbance.  Respiratory:  Negative for shortness of breath.   Cardiovascular:  Positive for chest pain. Negative for palpitations.  Gastrointestinal:  Negative for abdominal pain, diarrhea and vomiting.  Musculoskeletal:  Negative for arthralgias and myalgias.  Skin:  Negative for color change and rash.  Neurological:  Negative for tremors, syncope and headaches.  Psychiatric/Behavioral:  Negative for confusion and dysphoric mood.    Physical Exam Updated Vital Signs BP 101/72    Pulse 77    Temp 98.5 F (36.9 C)    Resp 16    SpO2 96%   Physical Exam Vitals and nursing note reviewed.  Constitutional:      Appearance: He is well-developed.  HENT:     Head: Normocephalic and atraumatic.  Eyes:     Pupils: Pupils are equal, round, and reactive to light.  Neck:     Vascular: No JVD.   Cardiovascular:     Rate and Rhythm: Normal rate and regular rhythm.     Heart sounds: No murmur heard.   No friction rub. No gallop.  Pulmonary:     Effort: No respiratory distress.     Breath sounds: No wheezing.  Abdominal:     General: There is no distension.     Tenderness: There is no abdominal tenderness. There is no guarding or rebound.  Musculoskeletal:        General: Normal range of motion.     Cervical back: Normal range of motion and neck supple.  Skin:    Coloration: Skin is not pale.     Findings: No rash.  Neurological:     Mental Status: He is alert and oriented to person, place, and time.  Psychiatric:        Behavior: Behavior normal.    ED Results / Procedures / Treatments   Labs (all labs ordered are listed, but only abnormal results are displayed) Labs Reviewed  RESP PANEL BY RT-PCR (FLU A&B, COVID) ARPGX2 - Abnormal; Notable for the following components:      Result Value   Influenza A by PCR POSITIVE (*)    All other components within normal limits  BASIC METABOLIC PANEL - Abnormal; Notable for the following components:   CO2 21 (*)    Glucose, Bld 116 (*)    All other components within normal limits  CBC WITH DIFFERENTIAL/PLATELET  TROPONIN I (HIGH SENSITIVITY)  TROPONIN I (HIGH SENSITIVITY)    EKG EKG Interpretation  Date/Time:  Friday April 18 2021 20:14:32 EST Ventricular Rate:  180 PR Interval:    QRS Duration: 80 QT Interval:  292 QTC Calculation: 505 R Axis:   39 Text Interpretation: afib w rwr Otherwise normal ECG Otherwise no significant change Confirmed by Deno Etienne [phone]) on 04/18/2021 9:46:43 PM  Radiology DG Chest 2 View  Result Date: 04/18/2021 CLINICAL DATA:  Chest pain. EXAM: CHEST - 2 VIEW COMPARISON:  Chest x-ray 11/10/2020. FINDINGS: The heart size and mediastinal contours are within normal limits. Both lungs are clear. Right shoulder arthroplasty is again seen. No acute fractures are identified. IMPRESSION: No  active cardiopulmonary disease. Electronically Signed   By: Ronney Asters M.D.   On: 04/18/2021 21:04    Procedures Procedures   Medications Ordered in ED Medications  levETIRAcetam (KEPPRA) tablet 500 mg (500 mg Oral Given 04/18/21 2145)    ED Course  I have reviewed the triage vital signs and the nursing notes.  Pertinent labs & imaging results that were available during my care of the patient were reviewed by me  and considered in my medical decision making (see chart for details).    MDM Rules/Calculators/A&P                         60 yo M with a cc of chest pain.  Patient suddenly felt unwell earlier today was having chest pain cough fevers myalgias.  2 sick contacts in his household.  Found to have influenza A here.  Of note the patient has an EKG in the system that is concerning for a very high heart rate likely A. fib with RVR.  He is very comfortable now with the rate in the 70s.  He feels much better, I offered to obtain a second troponin and give him IV fluids and observe him longer in the ED but he would prefer to go home.  Discussed risks and benefits of Tamiflu administration which he is declining.  PCP follow-up.  10:43 PM:  I have discussed the diagnosis/risks/treatment options with the patient and believe the pt to be eligible for discharge home to follow-up with PCP. We also discussed returning to the ED immediately if new or worsening sx occur. We discussed the sx which are most concerning (e.g., sudden worsening pain, fever, inability to tolerate by mouth) that necessitate immediate return. Medications administered to the patient during their visit and any new prescriptions provided to the patient are listed below.  Medications given during this visit Medications  levETIRAcetam (KEPPRA) tablet 500 mg (500 mg Oral Given 04/18/21 2145)     The patient appears reasonably screen and/or stabilized for discharge and I doubt any other medical condition or other [facility] requiring  further screening, evaluation, or treatment in the ED at this time prior to discharge.       Final Clinical Impression(s) / ED Diagnoses Final diagnoses:  Influenza A    Rx / DC Orders ED Discharge Orders          Ordered    benzonatate (TESSALON) 100 MG capsule  Every 8 hours        04/18/21 2215    ondansetron (ZOFRAN-ODT) 4 MG disintegrating tablet        04/18/21 2215             Deno Etienne, DO 04/18/21 2243

## 2021-07-01 ENCOUNTER — Ambulatory Visit: Payer: BC Managed Care – PPO | Admitting: Diagnostic Neuroimaging

## 2023-06-19 IMAGING — CT CT SHOULDER*R* W/O CM
1 series · 12 of 14 positions shown, 15 images · non-contrast
Comparison: None.

CLINICAL DATA: Increasing shoulder pain.  Osteoarthritis.

EXAM:
CT OF THE UPPER RIGHT EXTREMITY WITHOUT CONTRAST
TECHNIQUE: Multidetector CT imaging of the upper right extremity was performed
according to the standard protocol.

[Series 3: soft tissue · axial · 0.52mm/px · z∈[-254,-70]mm · 12 of 110 slices shown, 15 images]
[im 9/110  soft-tissue]
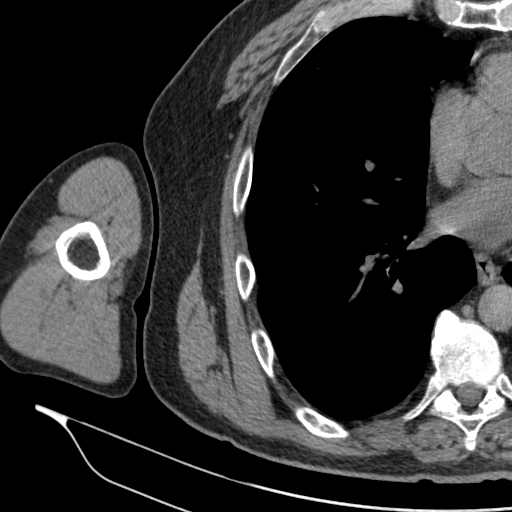
[im 9/110  bone]
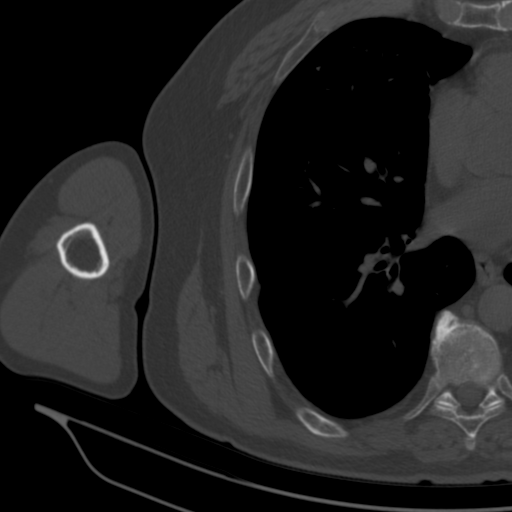
[im 17/110  bone]
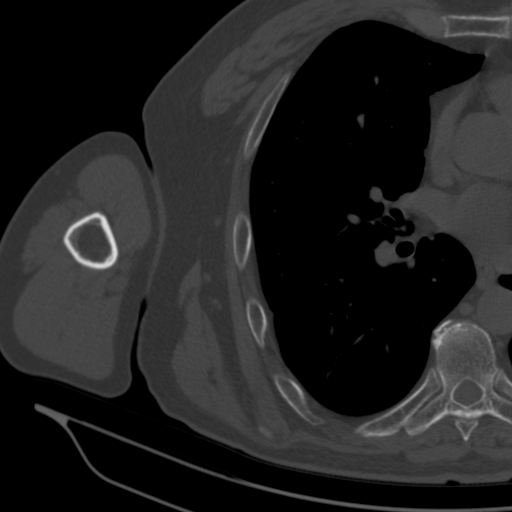
[im 26/110  bone]
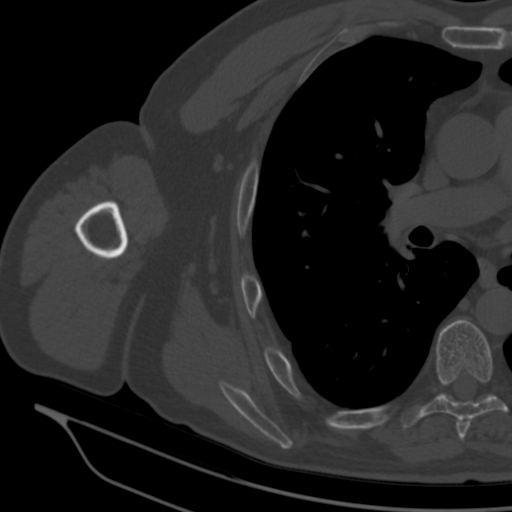
[im 34/110  bone]
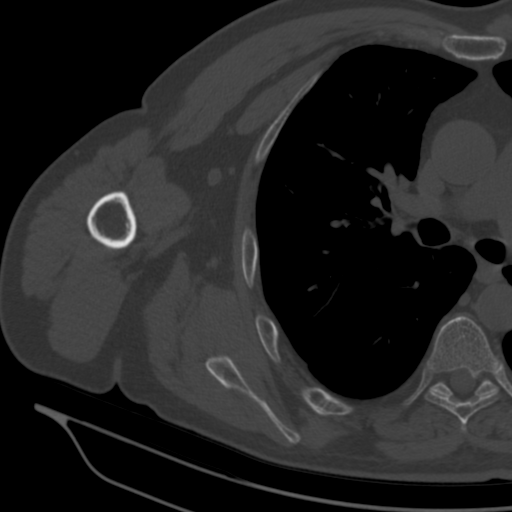
[im 42/110  soft-tissue]
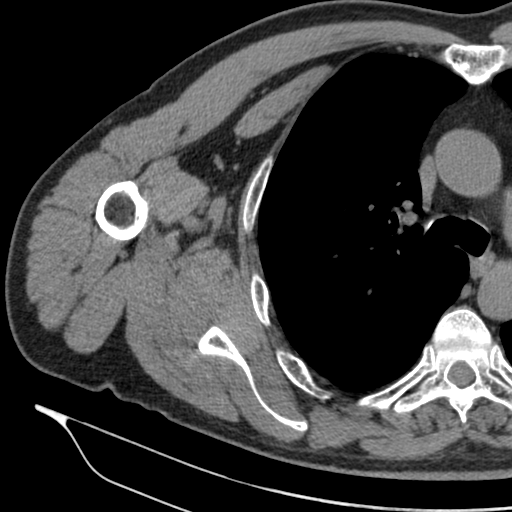
[im 42/110  bone]
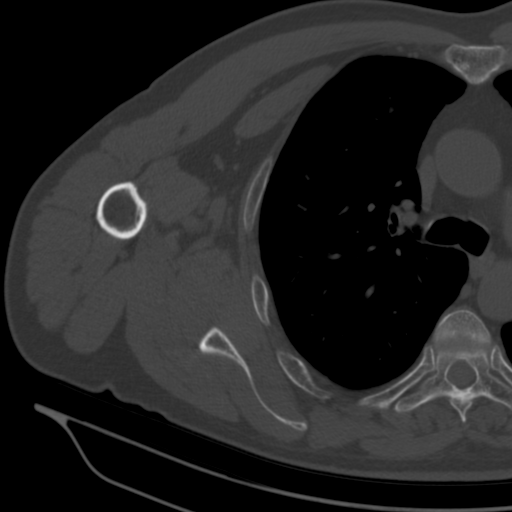
[im 51/110  bone]
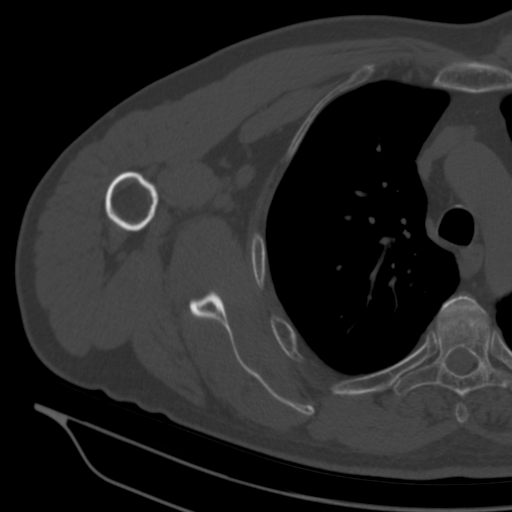
[im 59/110  bone]
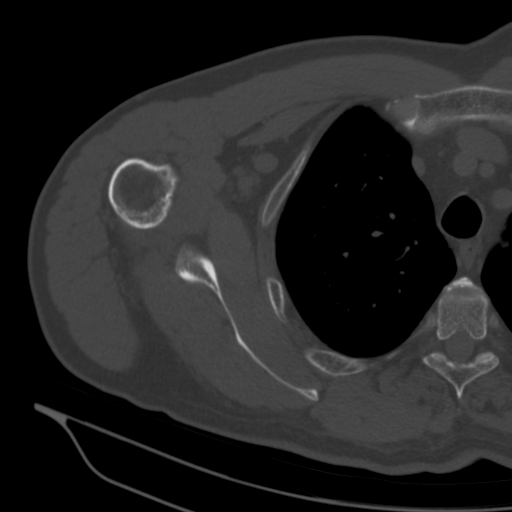
[im 68/110  bone]
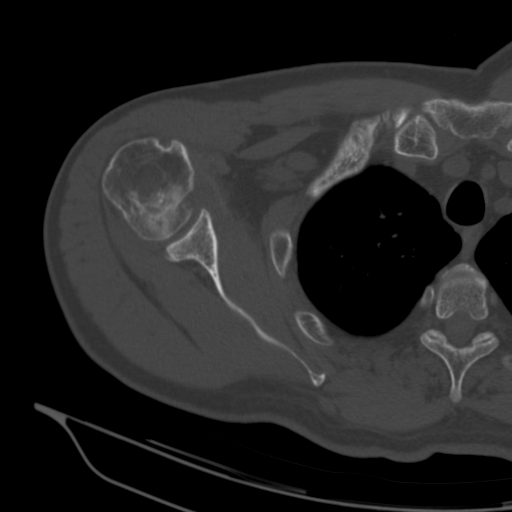
[im 76/110  soft-tissue]
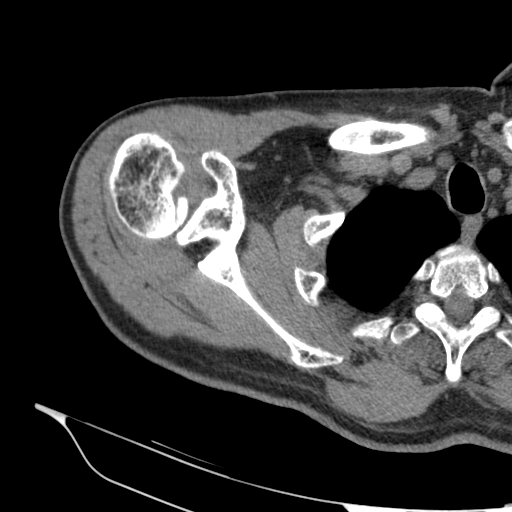
[im 76/110  bone]
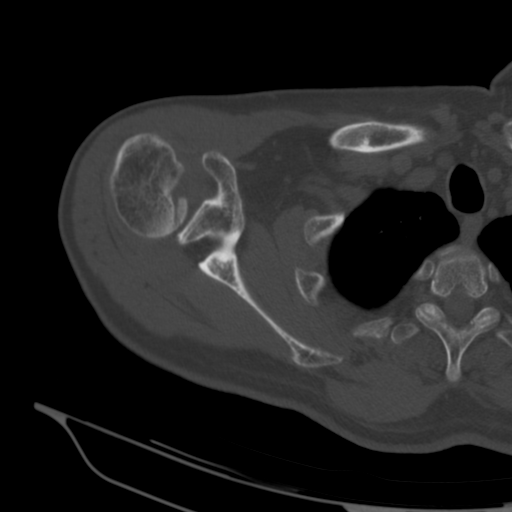
[im 84/110  bone]
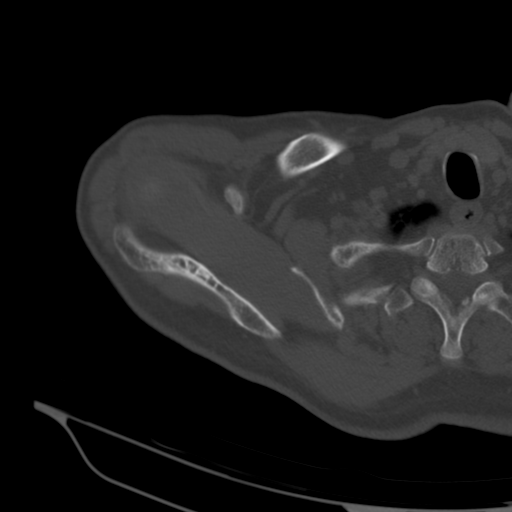
[im 93/110  bone]
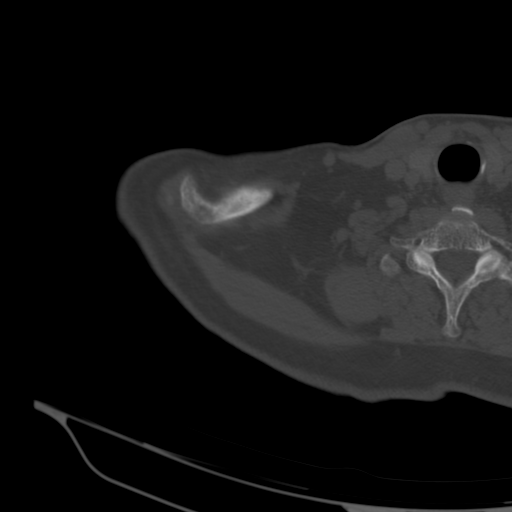
[im 101/110  bone]
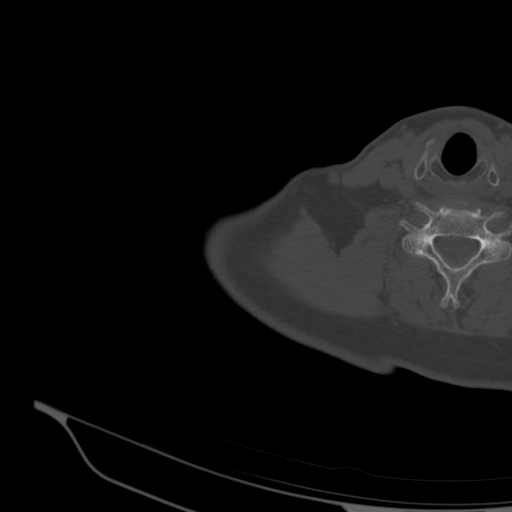

[12 of 14 positions shown; findings below may reference images not displayed]

FINDINGS: Bones/Joint/Cartilage

No acute fracture or dislocation. Normal alignment. Moderate joint
effusion.

Avascular necrosis of the medial humeral head with a subchondral
fracture and 2 mm of depression of the articular surface.

Mild osteoarthritis of the glenohumeral joint with mild joint space
narrowing.

Mild arthropathy of the acromioclavicular joint.

Ligaments

Ligaments are suboptimally evaluated by CT.

Muscles and Tendons
Muscles are normal.  No muscle atrophy.

Soft tissue
No fluid collection or hematoma. No soft tissue mass. Visualized
portions of the lung are clear.
IMPRESSION: 1. Avascular necrosis of the medial humeral head with a subchondral
fracture and 2 mm of depression of the articular surface.
2. Mild osteoarthritis of the glenohumeral joint.

## 2023-09-05 IMAGING — CR DG CHEST 2V
2 series · 2 of 2 positions shown · non-contrast
Comparison: Chest x-ray 11/10/2020.

CLINICAL DATA: Chest pain.

EXAM:
CHEST - 2 VIEW

[chest pa]
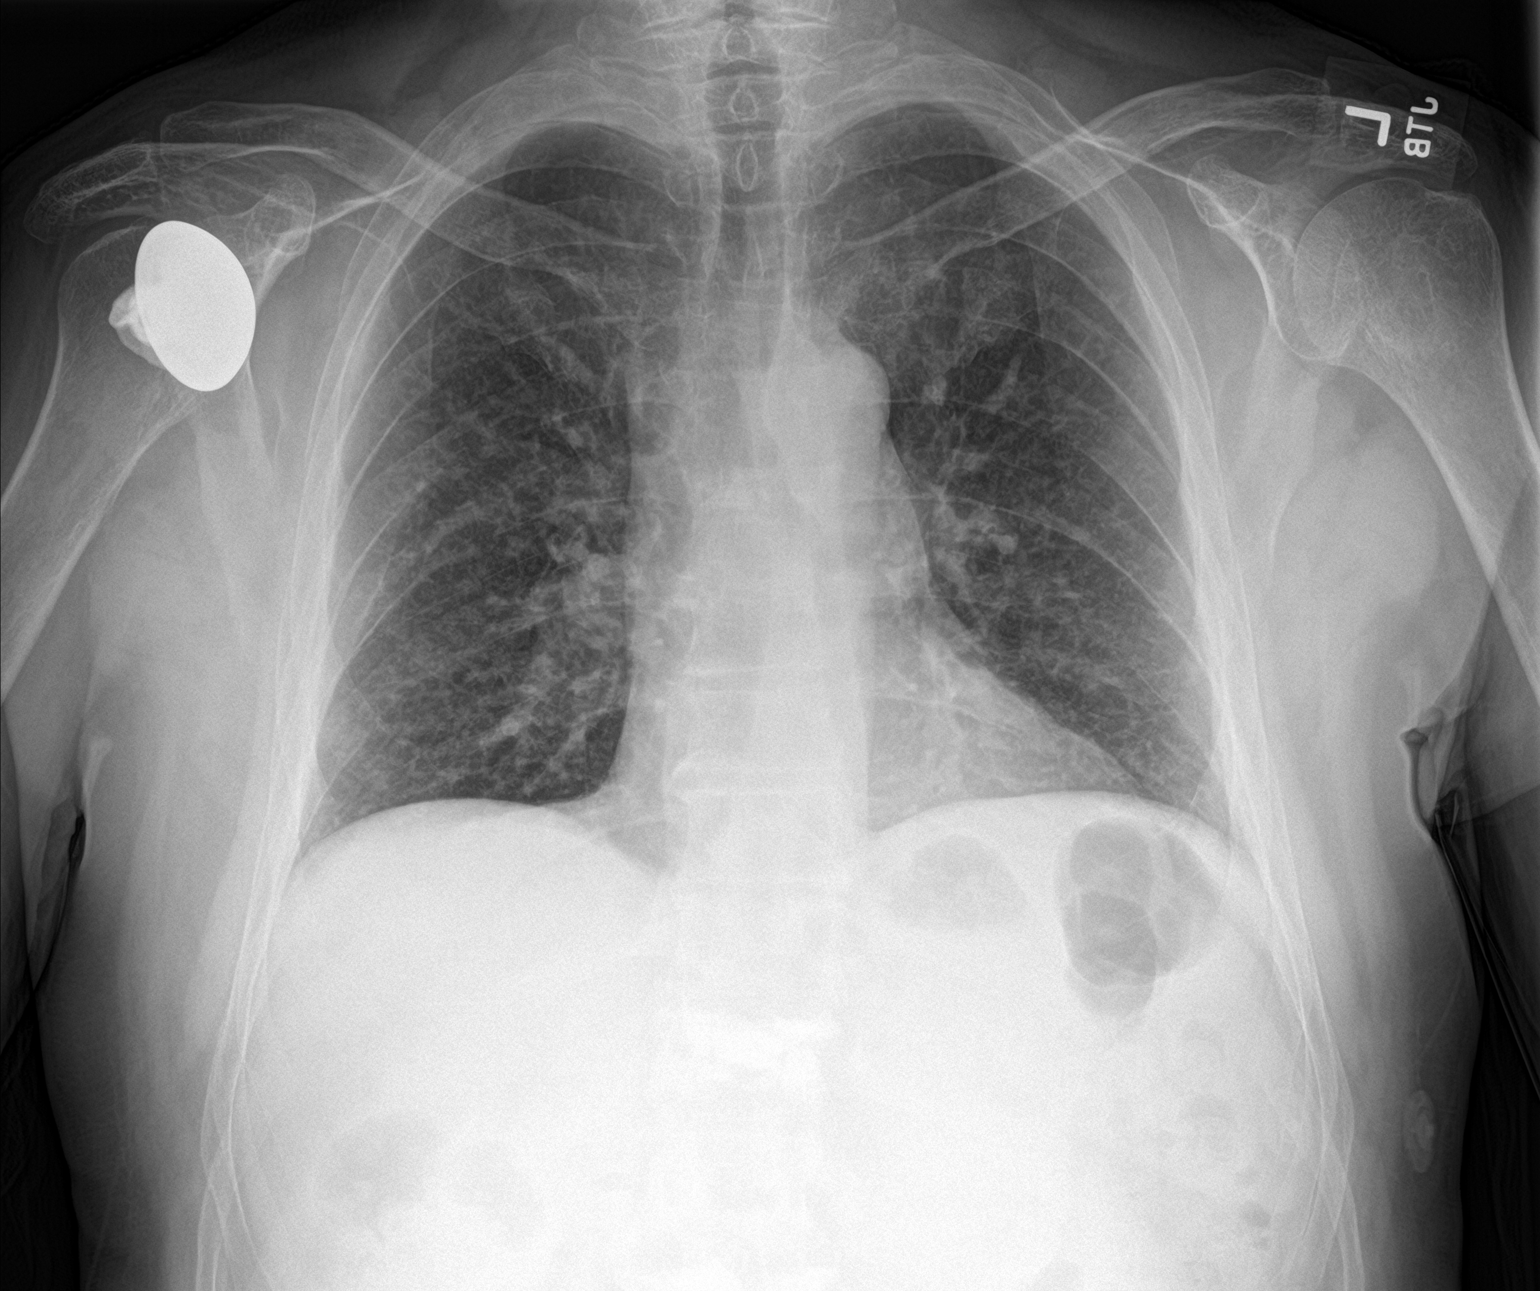

[chest lat]
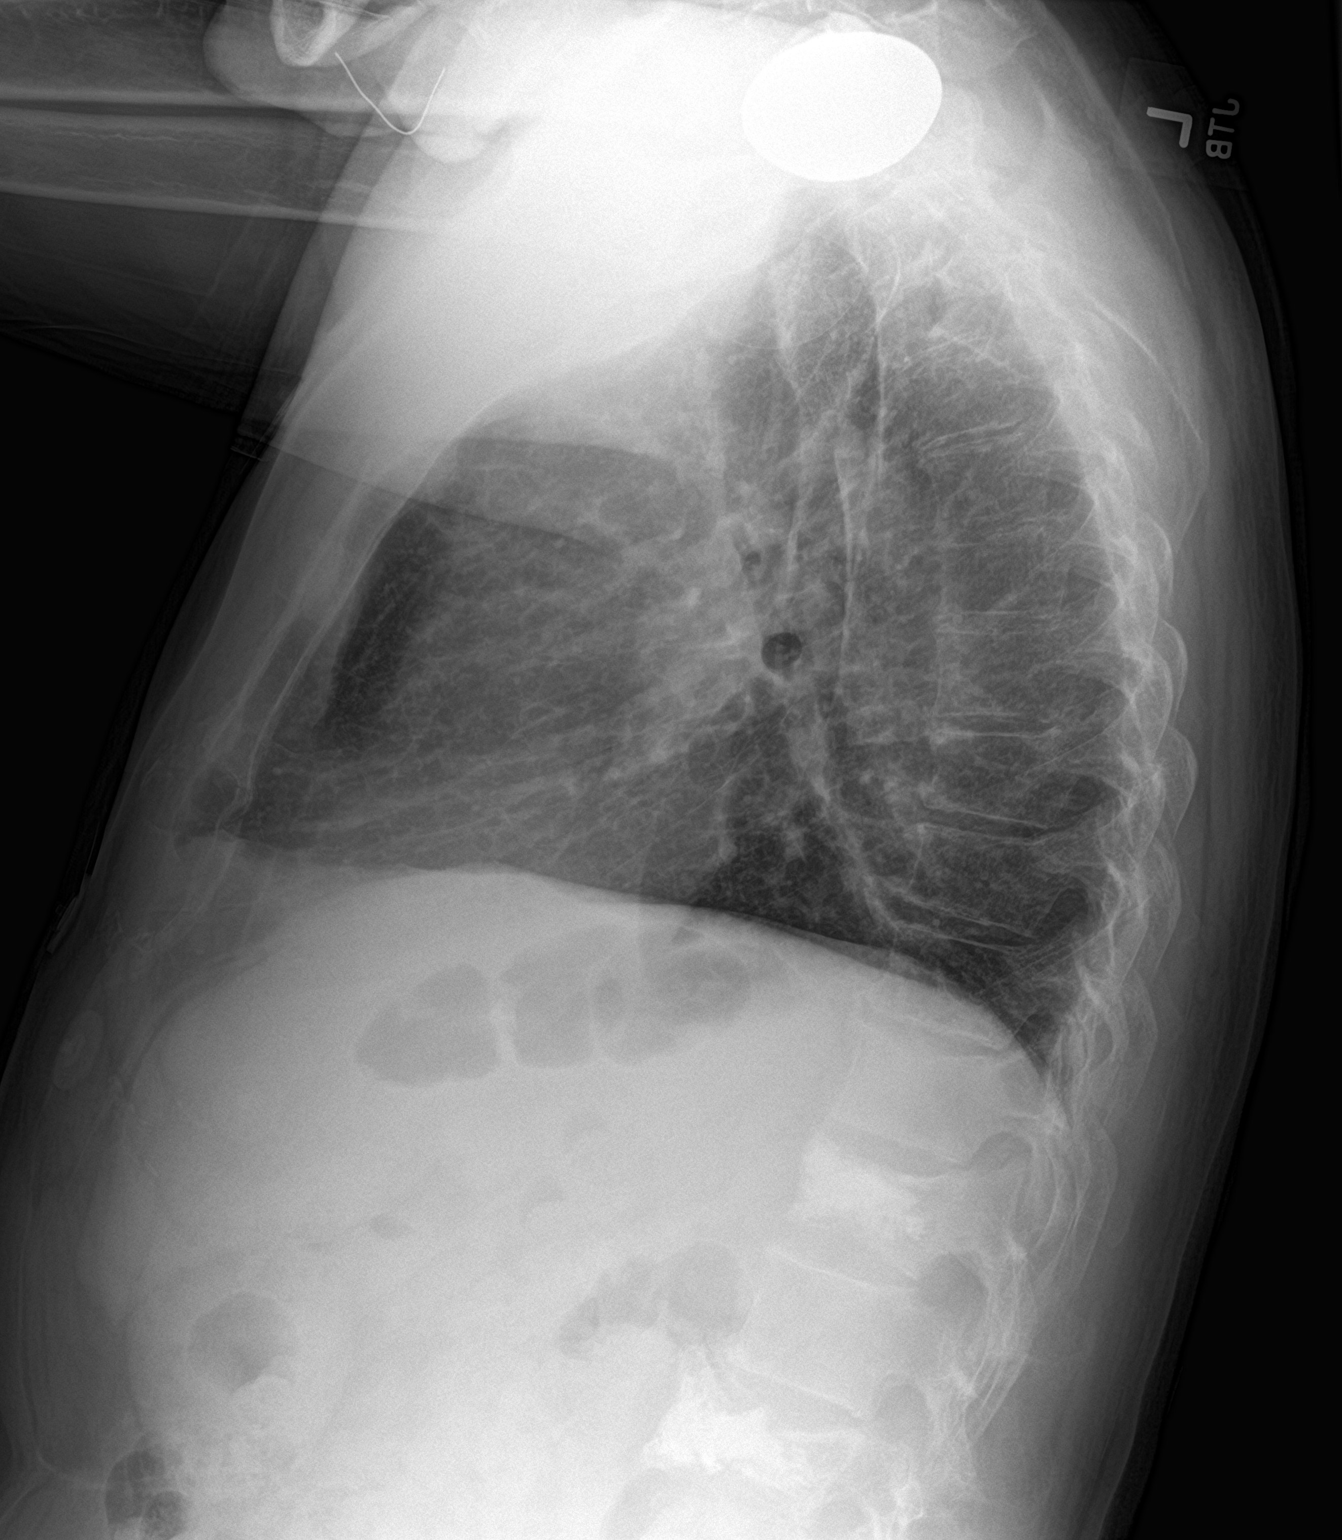

[2 of 2 positions shown; findings below may reference images not displayed]

FINDINGS: The heart size and mediastinal contours are within normal limits.
Both lungs are clear. Right shoulder arthroplasty is again seen. No
acute fractures are identified.
IMPRESSION: No active cardiopulmonary disease.
